# Patient Record
Sex: Male | Born: 1975 | Race: Black or African American | Hispanic: No | Marital: Single | Smoking: Current every day smoker
Health system: Southern US, Community
[De-identification: ages and names within clinical notes are randomized; demographics above are authoritative.]

## PROBLEM LIST (undated history)

## (undated) DIAGNOSIS — F200 Paranoid schizophrenia: Secondary | ICD-10-CM

## (undated) DIAGNOSIS — Z720 Tobacco use: Secondary | ICD-10-CM

## (undated) DIAGNOSIS — K5090 Crohn's disease, unspecified, without complications: Secondary | ICD-10-CM

## (undated) HISTORY — PX: COLON SURGERY: SHX602

---

## 2005-07-10 ENCOUNTER — Ambulatory Visit: Payer: Self-pay | Admitting: Internal Medicine

## 2006-07-26 ENCOUNTER — Ambulatory Visit: Payer: Self-pay | Admitting: Internal Medicine

## 2006-10-10 ENCOUNTER — Emergency Department (HOSPITAL_COMMUNITY): Admission: EM | Admit: 2006-10-10 | Discharge: 2006-10-10 | Payer: Self-pay | Admitting: Emergency Medicine

## 2007-10-15 ENCOUNTER — Emergency Department (HOSPITAL_COMMUNITY): Admission: EM | Admit: 2007-10-15 | Discharge: 2007-10-15 | Payer: Self-pay | Admitting: Emergency Medicine

## 2008-07-18 ENCOUNTER — Other Ambulatory Visit: Payer: Self-pay | Admitting: Emergency Medicine

## 2008-07-18 ENCOUNTER — Ambulatory Visit: Payer: Self-pay | Admitting: Psychiatry

## 2008-07-18 ENCOUNTER — Inpatient Hospital Stay (HOSPITAL_COMMUNITY): Admission: RE | Admit: 2008-07-18 | Discharge: 2008-07-25 | Payer: Self-pay | Admitting: Psychiatry

## 2008-10-17 ENCOUNTER — Emergency Department (HOSPITAL_COMMUNITY): Admission: EM | Admit: 2008-10-17 | Discharge: 2008-10-17 | Payer: Self-pay | Admitting: Emergency Medicine

## 2008-12-27 ENCOUNTER — Emergency Department (HOSPITAL_COMMUNITY): Admission: EM | Admit: 2008-12-27 | Discharge: 2008-12-27 | Payer: Self-pay | Admitting: Emergency Medicine

## 2010-06-03 LAB — POCT I-STAT, CHEM 8
Calcium, Ion: 1.26 mmol/L (ref 1.12–1.32)
Glucose, Bld: 136 mg/dL — ABNORMAL HIGH (ref 70–99)
HCT: 44 % (ref 39.0–52.0)
Hemoglobin: 15 g/dL (ref 13.0–17.0)
TCO2: 21 mmol/L (ref 0–100)

## 2010-06-03 LAB — RAPID URINE DRUG SCREEN, HOSP PERFORMED
Amphetamines: NOT DETECTED
Benzodiazepines: NOT DETECTED
Cocaine: NOT DETECTED
Opiates: NOT DETECTED
Tetrahydrocannabinol: NOT DETECTED

## 2010-06-03 LAB — ETHANOL: Alcohol, Ethyl (B): <5 mg/dL (ref 0–10)

## 2010-07-08 NOTE — Discharge Summary (Signed)
NAMELELON, IKARD NO.:  [id number]   MEDICAL RECORD NO.:  [id number]          PATIENT TYPE:  IPS   LOCATION:  0402                          FACILITY:  [facility]   PHYSICIAN:  Anselm Jungling, MD  DATE OF BIRTH:  01-17-1976   DATE OF ADMISSION:  07/18/2008  DATE OF DISCHARGE:  07/25/2008                               DISCHARGE SUMMARY   IDENTIFYING DATA AND REASON FOR ADMISSION:  This was an inpatient  psychiatric admission for Larry Leonard, a 35 year old male, who was admitted  due to increasing symptoms of psychosis with suicidal ideation.  Please  refer to the admission note for further details pertaining to the  symptoms, circumstances, and history that led to his hospitalization.  He was given an initial Axis I diagnosis of psychosis NOS.   MEDICAL AND LABORATORY:  The patient was medically and physically  assessed by the psychiatric nurse practitioner.  He came to [place] with a  history of Crohn's disease.  He was continued on his usual Pentasa and  calcium carbonate.  There were no significant medical issues.   HOSPITAL COURSE:  The patient was admitted to the adult inpatient  psychiatric service.  He presented as a well-nourished, normally-  developed adult male who was generally pleasant and cooperative.  He was  fully oriented.  Though he was pleasant, his thoughts were disorganized.  He had some insight into the need for treatment, and was accepting of  his hospitalization and need for medication.  He denied any active  suicidal plan, impulse or intent.   He was treated with a psychotropic regimen of Seroquel, and Cogentin was  added to address some mild akathisia that occurred later on in his stay.  He participated in therapeutic groups and activities, and was a  reasonably good participant.  He remained cooperative, nonirritable, and  polite throughout his stay.   He appeared appropriate for discharge on the eighth hospital day.  His  guardian, Ms.  Larry Leonard, was contacted by phone, and discharge and  aftercare were coordinated with her.   DISCHARGE AND AFTERCARE PLAN:  The patient was to follow up with his  usual outpatient providers.  Times and dates to be arranged at the time  of this dictation.   DISCHARGE MEDICATIONS:  1. Seroquel 1000 mg q.h.s.  2. Pentasa 500 SR daily.  3. Calcium carbonate 500 mg 3 times daily.  4. Cogentin 1 mg b.i.d.   The patient was given 7 days of samples plus 30 day prescriptions for  the above.   DISCHARGE DIAGNOSES:  Axis I:  Schizophrenia, chronic undifferentiated  type, acute exacerbation, resolving.  Axis II:  Deferred.  Axis III:  History of Crohn's disease.  Axis IV:  Stressors severe.  Axis V:  Global assessment of functioning on discharge 55.      Anselm Jungling, MD  Electronically Signed     SPB/MEDQ  D:  07/25/2008  T:  07/25/2008  Job:  [id number]

## 2010-07-08 NOTE — H&P (Signed)
Larry Leonard, NORGAARD NO.:  [id number]   MEDICAL RECORD NO.:  [id number]          PATIENT TYPE:  IPS   LOCATION:  0403                          FACILITY:  [facility]   PHYSICIAN:  Anselm Jungling, MD  DATE OF BIRTH:  June 12, 1975   DATE OF ADMISSION:  07/18/2008  DATE OF DISCHARGE:                       PSYCHIATRIC ADMISSION ASSESSMENT   This is on a 35 year old male that was involuntarily petitioned on Jul 18, 2008.   HISTORY OF PRESENT ILLNESS:  The patient is here on petition with papers  stating he is delusional, suicidal and responding to internal stimuli.  The patient was sent to the emergency department after the mobile crisis  was called as patient has been decompensating.  The patient does endorse  visual hallucinations, but of unknown content.   PAST PSYCHIATRIC HISTORY:  First admission to [facility].  Unclear if the patient has had past hospitalizations.  The patient  apparently may be a client at [facility].   SOCIAL HISTORY:  This is a 35 year old male who has been living at the  [facility].  He has an 11th grade education.  His support system is  unknown.   FAMILY HISTORY:  Unknown.   ALCOHOL/DRUG HISTORY:  No apparent alcohol or drug use.   PRIMARY CARE [name]:  Unknown.   MEDICAL HISTORY:  History of Crohn's disease.   MEDICATIONS:  1. __________.  2. Calcium.  3. Seroquel.  4. Wellbutrin.  Compliance is also unclear.   DRUG ALLERGIES:  NO KNOWN ALLERGIES.   PHYSICAL EXAMINATION:  GENERAL:  This is a thin unkempt male.  He was  fully assessed at [facility].  He did receive  Geodon 10 mg IM twice.  VITAL SIGNS:  Temperature is 97.4, 85 heart rate, 112/70 blood pressure.   LABORATORY DATA:  Urine drug screen was negative.  Alcohol level was  less than 5.   MENTAL STATUS EXAM:  The patient was resting in his bed.  When awakened,  he seemed very confused and jumped out of bed.   He is currently dressed  in hospital scrubs.  There is no eye contact.  His speech is gibberish,  difficult to understand any words.  Noted in the chart that the patient  was initially religiously preoccupied.  Does appear the patient could be  responding to internal stimuli.  He is a poor historian and poor  judgment.   AXIS I:  Psychosis not otherwise specified.  AXIS II:  Deferred.  AXIS III:  History of Crohn's disease.  AXIS IV:  Deferred at this time.  AXIS V:  Current is 20.   PLAN:  Our plan is to continue to gather more history and identify  support.  At this time, we will continue with the Seroquel to stabilize  his mood and thinking.  Identify his living arrangements.  His tentative  length of stay at this time initially is 4-6 days depending on the  patient's response to medication.      [name], N.P.      Anselm Jungling, MD  Electronically Signed    JO/MEDQ  D:  07/19/2008  T:  07/19/2008  Job:  [id number]

## 2010-07-08 NOTE — Assessment & Plan Note (Signed)
NAMEMarland Kitchen  Larry, Leonard                  CHART#:  [id number]   DATE:  07/26/2006                       DOB:  November 05, 1975   Followup of Crohn's disease.  Last seen 07/10/2005.  He has a history of  small bowel Crohn's disease.  He has done well.  He has gained 4 pounds  since his last visit and he has 1-2 bowel movements daily.  Takes a  Questran 4 g daily p.r.n., Pentasa 1 g 3-4 times daily.  He really has  had not much with abdominal pain, nausea, or vomiting, no melena, rectal  bleeding.  His sense of well being has been good.  He has not had any  other significant concurrent medical problems.   CURRENT MEDICATIONS:  See updated list.   ALLERGIES:  No known drug allergies.   PHYSICAL EXAMINATION:  He looks well.  Weight 158, height 5 feet 6 inches, temp 97.8, BP 120/84, pulse 80.  ABDOMEN:  Flat, positive bowel sounds.  Well-healed surgical scar, soft,  nontender without appreciable mass or organomegaly.   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 Questran.  Overall, he is doing very well.  He needs some routine  blood work, need to check renal function, etc.  Basically, we will get a  reference CRP, CBC, and a BMET today.  Continue Pentasa 1 g t.i.d.  May  use Questran no more than once daily, not to be taken within 2 hours of  other meds.  Telephone followup.  Tentatively plan to see him back in 1  year's time.       Larry Leonard, M.D.  Electronically Signed     RMR/MEDQ  D:  07/26/2006  T:  07/26/2006  Job:  [id number]   cc:   Tesfaye D. Felecia Shelling, MD

## 2010-09-21 ENCOUNTER — Emergency Department (HOSPITAL_COMMUNITY)
Admission: EM | Admit: 2010-09-21 | Discharge: 2010-09-21 | Disposition: A | Discharge status: Home / Self Care | Payer: Medicare Other | Attending: Emergency Medicine | Admitting: Emergency Medicine

## 2010-09-21 ENCOUNTER — Emergency Department (HOSPITAL_COMMUNITY): Payer: Medicare Other

## 2010-09-21 DIAGNOSIS — H5710 Ocular pain, unspecified eye: Secondary | ICD-10-CM | POA: Insufficient documentation

## 2010-09-21 DIAGNOSIS — S0003XA Contusion of scalp, initial encounter: Secondary | ICD-10-CM | POA: Insufficient documentation

## 2010-09-21 DIAGNOSIS — S00209A Unspecified superficial injury of unspecified eyelid and periocular area, initial encounter: Secondary | ICD-10-CM | POA: Insufficient documentation

## 2010-10-10 ENCOUNTER — Emergency Department (HOSPITAL_COMMUNITY)
Admission: EM | Admit: 2010-10-10 | Discharge: 2010-10-10 | Disposition: A | Discharge status: Home / Self Care | Payer: Medicare Other | Attending: Emergency Medicine | Admitting: Emergency Medicine

## 2010-10-10 DIAGNOSIS — K5090 Crohn's disease, unspecified, without complications: Secondary | ICD-10-CM | POA: Insufficient documentation

## 2010-10-10 DIAGNOSIS — F3289 Other specified depressive episodes: Secondary | ICD-10-CM | POA: Insufficient documentation

## 2010-10-10 DIAGNOSIS — Z79899 Other long term (current) drug therapy: Secondary | ICD-10-CM | POA: Insufficient documentation

## 2010-10-10 DIAGNOSIS — S51809A Unspecified open wound of unspecified forearm, initial encounter: Secondary | ICD-10-CM | POA: Insufficient documentation

## 2010-10-10 DIAGNOSIS — F329 Major depressive disorder, single episode, unspecified: Secondary | ICD-10-CM | POA: Insufficient documentation

## 2010-10-10 DIAGNOSIS — M79609 Pain in unspecified limb: Secondary | ICD-10-CM | POA: Insufficient documentation

## 2010-10-21 ENCOUNTER — Emergency Department (HOSPITAL_COMMUNITY)
Admission: EM | Admit: 2010-10-21 | Discharge: 2010-10-21 | Disposition: A | Discharge status: Home / Self Care | Payer: Medicare Other | Attending: Emergency Medicine | Admitting: Emergency Medicine

## 2010-10-21 DIAGNOSIS — Z4802 Encounter for removal of sutures: Secondary | ICD-10-CM | POA: Insufficient documentation

## 2013-12-14 ENCOUNTER — Encounter (HOSPITAL_COMMUNITY): Payer: Self-pay | Admitting: Emergency Medicine

## 2013-12-14 ENCOUNTER — Emergency Department (HOSPITAL_COMMUNITY)
Admission: EM | Admit: 2013-12-14 | Discharge: 2013-12-15 | Disposition: A | Discharge status: Home / Self Care | Payer: Medicare Other | Attending: Emergency Medicine | Admitting: Emergency Medicine

## 2013-12-14 DIAGNOSIS — M79605 Pain in left leg: Secondary | ICD-10-CM

## 2013-12-14 DIAGNOSIS — Z8719 Personal history of other diseases of the digestive system: Secondary | ICD-10-CM | POA: Diagnosis not present

## 2013-12-14 DIAGNOSIS — Z8659 Personal history of other mental and behavioral disorders: Secondary | ICD-10-CM | POA: Diagnosis not present

## 2013-12-14 HISTORY — DX: Crohn's disease, unspecified, without complications: K50.90

## 2013-12-14 HISTORY — DX: Paranoid schizophrenia: F20.0

## 2013-12-14 MED ORDER — ENOXAPARIN SODIUM 60 MG/0.6ML ~~LOC~~ SOLN
60.0000 mg | Freq: Once | SUBCUTANEOUS | Status: AC
  Administered 2013-12-14: 60 mg via SUBCUTANEOUS
  Filled 2013-12-14: qty 0.6

## 2013-12-14 MED ORDER — HYDROCODONE-ACETAMINOPHEN 5-325 MG PO TABS
1.0000 | ORAL_TABLET | Freq: Once | ORAL | Status: AC
  Administered 2013-12-14: 1 via ORAL
  Filled 2013-12-14: qty 1

## 2013-12-14 MED ORDER — HYDROCODONE-ACETAMINOPHEN 5-325 MG PO TABS: 1.0000 | ORAL_TABLET | ORAL | Status: AC | PRN

## 2013-12-14 NOTE — ED Notes (Addendum)
Per EMS pt is c/o left hamstring pain x 2 weeks  Pt states he is tired of the pain so came for evaluation  Denies injury  [facility]

## 2013-12-14 NOTE — ED Notes (Signed)
Patient reports that sitting on a soft pillow occasionally reduces the pain in his left upper leg.

## 2013-12-14 NOTE — ED Provider Notes (Signed)
CSN: [id number]     Arrival date & time 12/14/13  2149 History   First MD Initiated Contact with Patient 12/14/13 2157     This chart was scribed for non-physician practitioner, Trixie DredgeEmily Ardys Hataway PA-C working with Gwyneth SproutWhitney Plunkett, MD by Arlan OrganAshley Leger, ED Scribe. This patient was seen in room WTR9/WTR9 and the patient's care was started at 10:35 PM.   Chief Complaint  Patient presents with  . Leg Pain   The history is provided by the patient. No language interpreter was used.    HPI Comments: Larry SheehanKevin D Leonard bought in by EMS is a 38 y.o. male  PMHx of paranoid schizophrenia and crohn disease who presents to the Emergency Department complaining of constant, moderate L posterior thigh and left calf pain x 2 weeks that is not improving. He describes pain as sharp/stabbing and states radiates just below the L buttock. Pain rated 10/10 in last week. No recent injury or trauma to lower extremities. Pain is exacerbated at night time without and is typically alleviated with rest. No fever, chills, rash, or discoloration of the skin. No leg swelling. He denies any numbness or paresthesia to the lower extremities. No bleeding associated with Crohn's disease. No recent long distance travel or immobilization. He denies a personal or family history of blood clots. [facility]. No known allergies to medications.  No past medical history on file. No past surgical history on file. No family history on file. History  Substance Use Topics  . Smoking status: Not on file  . Smokeless tobacco: Not on file  . Alcohol Use: Not on file    Review of Systems  Constitutional: Negative for fever and chills.  Musculoskeletal: Positive for arthralgias.  Skin: Negative for color change and rash.  All other systems reviewed and are negative.     Allergies  Review of patient's allergies indicates not on file.  Home Medications   Prior to Admission medications   Not on File   Triage  Vitals: BP 137/85  Pulse 64  Resp 18  SpO2 99%   Physical Exam  Nursing note and vitals reviewed. Constitutional: He appears well-developed and well-nourished. No distress.  HENT:  Head: Normocephalic and atraumatic.  Neck: Neck supple.  Cardiovascular:  Pulses:      Dorsalis pedis pulses are 2+ on the left side.       Posterior tibial pulses are 2+ on the left side.  Strong distal pulses Tender to L calf and posterior calf  Pulmonary/Chest: Effort normal.  Musculoskeletal:  No skin changes No erythema or edema No focal tenderness Tenderness to palpation to posterior L thigh and L calf  L thigh measures 44 cm R thigh measures 43 cm L calf  measures 34.5 cm R calf measures 33 cm  Spine nontender, no crepitus, or stepoffs. Lower extremities:  Strength 5/5, sensation intact, distal pulses intact.     Neurological: He is alert.  Skin: He is not diaphoretic.    ED Course  Procedures (including critical care time)  DIAGNOSTIC STUDIES: Oxygen Saturation is 99% on RA, Normal by my interpretation.    COORDINATION OF CARE: 10:35 PM-Discussed treatment plan with pt at bedside and pt agreed to plan.     Labs Review Labs Reviewed - No data to display  Imaging Review No results found.   EKG Interpretation None      MDM   Final diagnoses:  Left leg pain    Afebrile, nontoxic patient with  left leg pain x 2 weeks.  Tender over posterior thigh and left calf.  Legs measured, left is slightly larger than right.  Pt reporting 10/10 pain. No noted injury.  Neurovascularly intact. No clinical e/o cellulitis.  Doubt radicular etiology of pain.  Likely muscular though will r/o DVT with venous duplex US in the morning.  Given Lovenox and norco in ED.   D/C home with norco and [facility]   Discussed result, findings, treatment, and follow up  with patient.  Pt given return precautions.  Pt verbalizes understanding and agrees with plan.       I  personally performed the services described in this documentation, which was scribed in my presence. The recorded information has been reviewed and is accurate.    Trixie DredgeEmily Martesha Niedermeier, PA-C 12/15/13 0006

## 2013-12-14 NOTE — Discharge Instructions (Signed)
Read the information below.  Use the prescribed medication as directed.  Please discuss all new medications with your pharmacist.  Do not take additional tylenol while taking the prescribed pain medication to avoid overdose.  You may return to the Emergency Department at any time for worsening condition or any new symptoms that concern you.  If you develop uncontrolled pain, weakness or numbness of the extremity, severe discoloration of the skin, or you are unable to walk, return to the ER for a recheck.      Musculoskeletal Pain Musculoskeletal pain is muscle and boney aches and pains. These pains can occur in any part of the body. Your caregiver may treat you without knowing the cause of the pain. They may treat you if blood or urine tests, X-rays, and other tests were normal.  CAUSES There is often not a definite cause or reason for these pains. These pains may be caused by a type of germ (virus). The discomfort may also come from overuse. Overuse includes working out too hard when your body is not fit. Boney aches also come from weather changes. Bone is sensitive to atmospheric pressure changes. HOME CARE INSTRUCTIONS   Ask when your test results will be ready. Make sure you get your test results.  Only take over-the-counter or prescription medicines for pain, discomfort, or fever as directed by your caregiver. If you were given medications for your condition, do not drive, operate machinery or power tools, or sign legal documents for 24 hours. Do not drink alcohol. Do not take sleeping pills or other medications that may interfere with treatment.  Continue all activities unless the activities cause more pain. When the pain lessens, slowly resume normal activities. Gradually increase the intensity and duration of the activities or exercise.  During periods of severe pain, bed rest may be helpful. Lay or sit in any position that is comfortable.  Putting ice on the injured area.  Put ice in a  bag.  Place a towel between your skin and the bag.  Leave the ice on for 15 to 20 minutes, 3 to 4 times a day.  Follow up with your caregiver for continued problems and no reason can be found for the pain. If the pain becomes worse or does not go away, it may be necessary to repeat tests or do additional testing. Your caregiver may need to look further for a possible cause. SEEK IMMEDIATE MEDICAL CARE IF:  You have pain that is getting worse and is not relieved by medications.  You develop chest pain that is associated with shortness or breath, sweating, feeling sick to your stomach (nauseous), or throw up (vomit).  Your pain becomes localized to the abdomen.  You develop any new symptoms that seem different or that concern you. MAKE SURE YOU:   Understand these instructions.  Will watch your condition.  Will get help right away if you are not doing well or get worse. Document Released: 02/09/2005 Document Revised: 05/04/2011 Document Reviewed: 10/14/2012 [facility] Patient Information 2015 [employer] [place]. This information is not intended to replace advice given to you by your health care provider. Make sure you discuss any questions you have with your health care provider.

## 2013-12-14 NOTE — ED Notes (Signed)
Emily, PA at bedside.

## 2013-12-15 NOTE — ED Notes (Signed)
Called [facility] for transport back to [facility]

## 2013-12-17 NOTE — ED Provider Notes (Signed)
Medical screening examination/treatment/procedure(s) were performed by non-physician practitioner and as supervising physician I was immediately available for consultation/collaboration.   EKG Interpretation None        Cleota Pellerito, MD 12/17/13 0809 

## 2014-10-22 ENCOUNTER — Encounter (HOSPITAL_COMMUNITY): Payer: Self-pay | Admitting: Emergency Medicine

## 2014-10-22 ENCOUNTER — Inpatient Hospital Stay (HOSPITAL_COMMUNITY)
Admission: EM | Admit: 2014-10-22 | Discharge: 2014-10-27 | DRG: 385 | Disposition: A | Discharge status: Home / Self Care | Payer: Medicare Other | Attending: Family Medicine | Admitting: Family Medicine

## 2014-10-22 ENCOUNTER — Emergency Department (HOSPITAL_COMMUNITY): Payer: Medicare Other

## 2014-10-22 DIAGNOSIS — K5010 Crohn's disease of large intestine without complications: Secondary | ICD-10-CM | POA: Insufficient documentation

## 2014-10-22 DIAGNOSIS — K5660 Unspecified intestinal obstruction: Secondary | ICD-10-CM | POA: Diagnosis present

## 2014-10-22 DIAGNOSIS — K5669 Other intestinal obstruction: Secondary | ICD-10-CM | POA: Diagnosis not present

## 2014-10-22 DIAGNOSIS — F200 Paranoid schizophrenia: Secondary | ICD-10-CM | POA: Diagnosis present

## 2014-10-22 DIAGNOSIS — Z79899 Other long term (current) drug therapy: Secondary | ICD-10-CM | POA: Diagnosis not present

## 2014-10-22 DIAGNOSIS — R1033 Periumbilical pain: Secondary | ICD-10-CM | POA: Insufficient documentation

## 2014-10-22 DIAGNOSIS — R001 Bradycardia, unspecified: Secondary | ICD-10-CM | POA: Diagnosis not present

## 2014-10-22 DIAGNOSIS — K50112 Crohn's disease of large intestine with intestinal obstruction: Secondary | ICD-10-CM

## 2014-10-22 DIAGNOSIS — E860 Dehydration: Secondary | ICD-10-CM | POA: Diagnosis present

## 2014-10-22 DIAGNOSIS — K50012 Crohn's disease of small intestine with intestinal obstruction: Principal | ICD-10-CM | POA: Diagnosis present

## 2014-10-22 DIAGNOSIS — E43 Unspecified severe protein-calorie malnutrition: Secondary | ICD-10-CM | POA: Diagnosis present

## 2014-10-22 DIAGNOSIS — F1020 Alcohol dependence, uncomplicated: Secondary | ICD-10-CM | POA: Diagnosis present

## 2014-10-22 DIAGNOSIS — R64 Cachexia: Secondary | ICD-10-CM | POA: Diagnosis present

## 2014-10-22 DIAGNOSIS — Z6820 Body mass index (BMI) 20.0-20.9, adult: Secondary | ICD-10-CM

## 2014-10-22 DIAGNOSIS — F17210 Nicotine dependence, cigarettes, uncomplicated: Secondary | ICD-10-CM | POA: Diagnosis present

## 2014-10-22 DIAGNOSIS — N390 Urinary tract infection, site not specified: Secondary | ICD-10-CM | POA: Diagnosis present

## 2014-10-22 DIAGNOSIS — K56609 Unspecified intestinal obstruction, unspecified as to partial versus complete obstruction: Secondary | ICD-10-CM | POA: Diagnosis present

## 2014-10-22 DIAGNOSIS — K50119 Crohn's disease of large intestine with unspecified complications: Secondary | ICD-10-CM | POA: Diagnosis not present

## 2014-10-22 DIAGNOSIS — Z720 Tobacco use: Secondary | ICD-10-CM | POA: Diagnosis not present

## 2014-10-22 DIAGNOSIS — Z8719 Personal history of other diseases of the digestive system: Secondary | ICD-10-CM

## 2014-10-22 HISTORY — DX: Tobacco use: Z72.0

## 2014-10-22 LAB — URINALYSIS, ROUTINE W REFLEX MICROSCOPIC
Glucose, UA: NEGATIVE mg/dL
HGB URINE DIPSTICK: NEGATIVE
Ketones, ur: 15 mg/dL — AB
Nitrite: NEGATIVE
PROTEIN: 30 mg/dL — AB
SPECIFIC GRAVITY, URINE: 1.034 — AB (ref 1.005–1.030)
UROBILINOGEN UA: 0.2 mg/dL (ref 0.0–1.0)
pH: 5.5 (ref 5.0–8.0)

## 2014-10-22 LAB — COMPREHENSIVE METABOLIC PANEL
ALBUMIN: 3.8 g/dL (ref 3.5–5.0)
ALT: 37 U/L (ref 17–63)
ANION GAP: 12 (ref 5–15)
AST: 26 U/L (ref 15–41)
Alkaline Phosphatase: 95 U/L (ref 38–126)
BUN: 6 mg/dL (ref 6–20)
CO2: 26 mmol/L (ref 22–32)
Calcium: 9.9 mg/dL (ref 8.9–10.3)
Chloride: 98 mmol/L — ABNORMAL LOW (ref 101–111)
Creatinine, Ser: 0.94 mg/dL (ref 0.61–1.24)
GFR calc non Af Amer: >60 mL/min (ref >60)
GLUCOSE: 179 mg/dL — AB (ref 65–99)
POTASSIUM: 4.2 mmol/L (ref 3.5–5.1)
SODIUM: 136 mmol/L (ref 135–145)
TOTAL PROTEIN: 8.7 g/dL — AB (ref 6.5–8.1)
Total Bilirubin: 0.6 mg/dL (ref 0.3–1.2)

## 2014-10-22 LAB — URINE MICROSCOPIC-ADD ON

## 2014-10-22 LAB — CBC
HEMATOCRIT: 44.6 % (ref 39.0–52.0)
HEMOGLOBIN: 15.5 g/dL (ref 13.0–17.0)
MCH: 27.2 pg (ref 26.0–34.0)
MCHC: 34.8 g/dL (ref 30.0–36.0)
MCV: 78.2 fL (ref 78.0–100.0)
Platelets: 322 10*3/uL (ref 150–400)
RBC: 5.7 MIL/uL (ref 4.22–5.81)
RDW: 15.1 % (ref 11.5–15.5)
WBC: 9.3 10*3/uL (ref 4.0–10.5)

## 2014-10-22 LAB — PREALBUMIN: Prealbumin: 16.1 mg/dL — ABNORMAL LOW (ref 18–38)

## 2014-10-22 LAB — LIPASE, BLOOD: Lipase: 16 U/L — ABNORMAL LOW (ref 22–51)

## 2014-10-22 MED ORDER — OLANZAPINE 10 MG PO TABS
10.0000 mg | ORAL_TABLET | Freq: Every day | ORAL | Status: DC
  Administered 2014-10-22 – 2014-10-26 (×5): 10 mg via ORAL
  Filled 2014-10-22 (×6): qty 1

## 2014-10-22 MED ORDER — ONDANSETRON 4 MG PO TBDP: 4.0000 mg | ORAL_TABLET | Freq: Once | ORAL | Status: DC | PRN

## 2014-10-22 MED ORDER — PROMETHAZINE HCL 25 MG/ML IJ SOLN: 12.5000 mg | Freq: Four times a day (QID) | INTRAMUSCULAR | Status: DC | PRN

## 2014-10-22 MED ORDER — ACETAMINOPHEN 650 MG RE SUPP: 650.0000 mg | Freq: Four times a day (QID) | RECTAL | Status: DC | PRN

## 2014-10-22 MED ORDER — PROMETHAZINE HCL 25 MG/ML IJ SOLN
12.5000 mg | Freq: Once | INTRAMUSCULAR | Status: AC
  Administered 2014-10-22: 12.5 mg via INTRAVENOUS
  Filled 2014-10-22: qty 1

## 2014-10-22 MED ORDER — GI COCKTAIL ~~LOC~~
30.0000 mL | Freq: Once | ORAL | Status: AC
  Administered 2014-10-22: 30 mL via ORAL
  Filled 2014-10-22: qty 30

## 2014-10-22 MED ORDER — ENOXAPARIN SODIUM 40 MG/0.4ML ~~LOC~~ SOLN
40.0000 mg | SUBCUTANEOUS | Status: DC
  Administered 2014-10-22 – 2014-10-26 (×5): 40 mg via SUBCUTANEOUS
  Filled 2014-10-22 (×5): qty 0.4

## 2014-10-22 MED ORDER — HYDROMORPHONE HCL 1 MG/ML IJ SOLN
1.0000 mg | Freq: Once | INTRAMUSCULAR | Status: AC
  Administered 2014-10-22: 1 mg via INTRAVENOUS
  Filled 2014-10-22: qty 1

## 2014-10-22 MED ORDER — METHYLPREDNISOLONE SODIUM SUCC 40 MG IJ SOLR
40.0000 mg | Freq: Two times a day (BID) | INTRAMUSCULAR | Status: DC
  Administered 2014-10-22 – 2014-10-26 (×8): 40 mg via INTRAVENOUS
  Filled 2014-10-22 (×8): qty 1

## 2014-10-22 MED ORDER — QUETIAPINE FUMARATE 50 MG PO TABS
50.0000 mg | ORAL_TABLET | Freq: Every day | ORAL | Status: DC
  Administered 2014-10-22 – 2014-10-26 (×5): 50 mg via ORAL
  Filled 2014-10-22 (×5): qty 1

## 2014-10-22 MED ORDER — ALBUTEROL SULFATE HFA 108 (90 BASE) MCG/ACT IN AERS: 1.0000 | INHALATION_SPRAY | Freq: Four times a day (QID) | RESPIRATORY_TRACT | Status: DC | PRN

## 2014-10-22 MED ORDER — IOHEXOL 300 MG/ML  SOLN
100.0000 mL | Freq: Once | INTRAMUSCULAR | Status: AC | PRN
  Administered 2014-10-22: 100 mL via INTRAVENOUS

## 2014-10-22 MED ORDER — PROMETHAZINE HCL 25 MG PO TABS: 12.5000 mg | ORAL_TABLET | Freq: Four times a day (QID) | ORAL | Status: DC | PRN

## 2014-10-22 MED ORDER — DEXTROSE 5 % IV SOLN
1.0000 g | Freq: Once | INTRAVENOUS | Status: AC
  Administered 2014-10-22: 1 g via INTRAVENOUS
  Filled 2014-10-22: qty 10

## 2014-10-22 MED ORDER — DEXTROSE-NACL 5-0.45 % IV SOLN
INTRAVENOUS | Status: DC
  Administered 2014-10-22 – 2014-10-25 (×7): via INTRAVENOUS

## 2014-10-22 MED ORDER — ALBUTEROL SULFATE (2.5 MG/3ML) 0.083% IN NEBU: 2.5000 mg | INHALATION_SOLUTION | Freq: Four times a day (QID) | RESPIRATORY_TRACT | Status: DC | PRN

## 2014-10-22 MED ORDER — IOHEXOL 300 MG/ML  SOLN
25.0000 mL | Freq: Once | INTRAMUSCULAR | Status: AC | PRN
  Administered 2014-10-22: 25 mL via ORAL

## 2014-10-22 MED ORDER — PROMETHAZINE HCL 25 MG RE SUPP
12.5000 mg | Freq: Four times a day (QID) | RECTAL | Status: DC | PRN
Start: 2014-10-22 — End: 2014-10-27

## 2014-10-22 MED ORDER — ONDANSETRON HCL 4 MG/2ML IJ SOLN
4.0000 mg | Freq: Once | INTRAMUSCULAR | Status: AC
  Administered 2014-10-22: 4 mg via INTRAVENOUS
  Filled 2014-10-22: qty 2

## 2014-10-22 MED ORDER — ACETAMINOPHEN 325 MG PO TABS: 650.0000 mg | ORAL_TABLET | Freq: Four times a day (QID) | ORAL | Status: DC | PRN

## 2014-10-22 NOTE — ED Notes (Signed)
Pt states that he is unable to give urine specimen at this time.

## 2014-10-22 NOTE — ED Provider Notes (Signed)
CSN: [id number]     Arrival date & time 10/22/14  0529 History   First MD Initiated Contact with Patient 10/22/14 0617     Chief Complaint  Patient presents with  . Abdominal Pain     (Consider location/radiation/quality/duration/timing/severity/associated sxs/prior Treatment) HPI Comments: Pt is a 39 yo male with history of crohn's, schizophrenia and appendectomy who presents to the ED from [facility] with complaint of abdominal pain, onset last night. Pt reports sharp, constant nonradiating pain at his umbilicus. Endorses N/V/D, nonbilious nonbloody emesis. He reports history of chronic diarrhea. Denies fever, headache, sore throat, SOB, CP, urinary sxs, blood in stool, numbness, tingling, weakness. Pt reports he has been having abdominal pain for the past 2 months but reports that it became worse last night after eating. Pt does not have a PCP and states he has not seen GI. Pt only endorses appendectomy for abdominal surgeries.   Patient is a 39 y.o. male presenting with abdominal pain.  Abdominal Pain Associated symptoms: diarrhea, nausea and vomiting     Past Medical History  Diagnosis Date  . Crohn's disease   . Paranoid schizophrenia   . Tobacco use 10/22/2014   Past Surgical History  Procedure Laterality Date  . Colon surgery     No family history on file. Social History  Substance Use Topics  . Smoking status: Current Every Day Smoker -- 1.00 packs/day    Types: Cigarettes    Start date: 02/23/1990  . Smokeless tobacco: None  . Alcohol Use: No    Review of Systems  Gastrointestinal: Positive for nausea, vomiting, abdominal pain and diarrhea.  All other systems reviewed and are negative.     Allergies  Review of patient's allergies indicates no known allergies.  Home Medications   Prior to Admission medications   Medication Sig Start Date End Date Taking? Authorizing Provider  albuterol (PROVENTIL HFA;VENTOLIN HFA) 108 (90 BASE) MCG/ACT inhaler Inhale 1 puff  into the lungs every 6 (six) hours as needed for wheezing or shortness of breath.   Yes Historical Provider, MD  ARIPiprazole (ABILIFY MAINTENA) 400 MG SUSR Inject 400 mg into the muscle every 30 (thirty) days.   Yes Historical Provider, MD  docusate sodium (COLACE) 100 MG capsule Take 100 mg by mouth daily.   Yes Historical Provider, MD  naltrexone (DEPADE) 50 MG tablet Take 50 mg by mouth daily.   Yes Historical Provider, MD  naproxen (NAPROSYN) 500 MG tablet Take 500 mg by mouth 2 (two) times daily as needed for moderate pain.    Yes Historical Provider, MD  OLANZapine (ZYPREXA) 10 MG tablet Take 10 mg by mouth at bedtime.   Yes Historical Provider, MD  QUEtiapine (SEROQUEL) 50 MG tablet Take 50 mg by mouth at bedtime.   Yes Historical Provider, MD  HYDROcodone-acetaminophen (NORCO/VICODIN) 5-325 MG per tablet Take 1 tablet by mouth every 4 (four) hours as needed for moderate pain or severe pain. Patient not taking: Reported on 10/22/2014 12/14/13   Trixie Dredge, PA-C   BP 124/79 mmHg  Pulse 66  Temp(Src) 97.6 F (36.4 C) (Oral)  Resp 16  SpO2 97% Physical Exam  Constitutional: He is oriented to person, place, and time. He appears well-developed and well-nourished.  HENT:  Head: Normocephalic and atraumatic.  Mouth/Throat: Oropharynx is clear and moist.  Eyes: Conjunctivae and EOM are normal. Pupils are equal, round, and reactive to light. Right eye exhibits no discharge. Left eye exhibits no discharge. No scleral icterus.  Neck: Normal range of  motion. Neck supple.  Cardiovascular: Normal rate, regular rhythm, normal heart sounds and intact distal pulses.   Pulmonary/Chest: Effort normal and breath sounds normal. He has no wheezes. He has no rales. He exhibits no tenderness.  Abdominal: Soft. Bowel sounds are normal. He exhibits no distension and no mass. There is tenderness. There is no rigidity, no rebound, no guarding and no CVA tenderness.  TTP at umbilicus.  Musculoskeletal: Normal  range of motion. He exhibits no edema.  Lymphadenopathy:    He has no cervical adenopathy.  Neurological: He is alert and oriented to person, place, and time.  Skin: Skin is warm and dry.  Nursing note and vitals reviewed.   ED Course  Procedures (including critical care time) Labs Review Labs Reviewed  LIPASE, BLOOD - Abnormal; Notable for the following:    Lipase 16 (*)    All other components within normal limits  COMPREHENSIVE METABOLIC PANEL - Abnormal; Notable for the following:    Chloride 98 (*)    Glucose, Bld 179 (*)    Total Protein 8.7 (*)    All other components within normal limits  URINALYSIS, ROUTINE W REFLEX MICROSCOPIC (NOT AT [facility]) - Abnormal; Notable for the following:    Color, Urine AMBER (*)    APPearance HAZY (*)    Specific Gravity, Urine 1.034 (*)    Bilirubin Urine SMALL (*)    Ketones, ur 15 (*)    Protein, ur 30 (*)    Leukocytes, UA TRACE (*)    All other components within normal limits  URINE MICROSCOPIC-ADD ON - Abnormal; Notable for the following:    Squamous Epithelial / LPF FEW (*)    Bacteria, UA MANY (*)    Casts GRANULAR CAST (*)    Crystals CA OXALATE CRYSTALS (*)    All other components within normal limits  URINE CULTURE  CBC  PREALBUMIN    Imaging Review Ct Abdomen Pelvis W Contrast  10/22/2014   CLINICAL DATA:  Umbilical pain for 2 months with nausea, vomiting, diarrhea for 2 days. History of Crohn's disease.  EXAM: CT ABDOMEN AND PELVIS WITH CONTRAST  TECHNIQUE: Multidetector CT imaging of the abdomen and pelvis was performed using the standard protocol following bolus administration of intravenous contrast.  CONTRAST:  OMNIPAQUE IOHEXOL 300 MG/ML  SOLN  COMPARISON:  None.  FINDINGS: Lower chest:  Clear lung bases.  Normal heart size.  Hepatobiliary: Normal liver. No hepatic lesion. Distended gallbladder which may be secondary to prolonged fasting. Normal caliber common bile duct.  Pancreas: Normal.  Spleen: Normal.   Adrenals/Urinary Tract: Normal adrenal glands. Normal kidneys. No obstructive uropathy. Normal bladder.  Stomach/Bowel: Fluid-filled distended stomach. Small bowel dilatation with numerous fluid-filled loops of small bowel measuring up to 3.9 cm. Severe terminal ileal bowel wall thickening and surrounding inflammatory changes. Prior cecal surgery with surgical suture line present. No abdominal or pelvic free fluid. No pneumatosis, pneumoperitoneum or portal venous gas.  Vascular/Lymphatic: Normal caliber abdominal aorta. No abdominal or pelvic lymphadenopathy.  Other: No fluid collection or hematoma.  Musculoskeletal: No acute osseous abnormality. No lytic or sclerotic osseous lesion.  IMPRESSION: 1. Severe terminal ileal bowel wall thickening and surrounding inflammatory changes most consistent with terminal ileitis as can be seen with Crohn's disease given the patient's history. This is resulting in small bowel obstruction with significant small bowel dilatation and gastric distension.   Electronically Signed   By: Elige Ko   On: 10/22/2014 10:16   Dg Chest Portable 1 View  10/22/2014   CLINICAL DATA:  Nasogastric tube insertion.  EXAM: PORTABLE CHEST - 1 VIEW  COMPARISON:  10/17/2008  FINDINGS: There has been placement of enteric catheter coiled on itself over the expected location of gastric body. A second radiopaque catheter is seen transversing the midline of thorax and upper abdomen, with tip collimated off the image.  Cardiomediastinal silhouette is normal. Mediastinal contours appear intact.  There is no evidence of focal airspace consolidation, pleural effusion or pneumothorax.  Osseous structures are without acute abnormality. Residual contrast is seen within the collecting system of both kidneys. There is gaseous distention of the visualized small bowel loops. Soft tissues are grossly normal.  IMPRESSION: Enteric catheter with distal tip seen overlying the expected location of gastric body.   Second catheter transversing the thorax with tip collimated off the image.  Gaseous distention of small bowel loops seen in the upper abdomen, similar to previously seen small bowel obstruction, demonstrated by CT from the same date.   Electronically Signed   By: Ted Mcalpine M.D.   On: 10/22/2014 11:48   I have personally reviewed and evaluated these images and lab results as part of my medical decision-making.  Filed Vitals:   10/22/14 1345  BP: 124/79  Pulse: 66  Temp:   Resp:    Meds given in ED:  Medications  ondansetron (ZOFRAN-ODT) disintegrating tablet 4 mg (not administered)  ondansetron (ZOFRAN) injection 4 mg (4 mg Intravenous Given 10/22/14 0623)  HYDROmorphone (DILAUDID) injection 1 mg (1 mg Intravenous Given 10/22/14 0732)  iohexol (OMNIPAQUE) 300 MG/ML solution 25 mL (25 mLs Oral Contrast Given 10/22/14 0736)  gi cocktail (Maalox,Lidocaine,Donnatal) (30 mLs Oral Given 10/22/14 0829)  promethazine (PHENERGAN) injection 12.5 mg (12.5 mg Intravenous Given 10/22/14 0830)  HYDROmorphone (DILAUDID) injection 1 mg (1 mg Intravenous Given 10/22/14 0925)  iohexol (OMNIPAQUE) 300 MG/ML solution 100 mL (100 mLs Intravenous Contrast Given 10/22/14 0955)  cefTRIAXone (ROCEPHIN) 1 g in dextrose 5 % 50 mL IVPB (0 g Intravenous Stopped 10/22/14 1125)    New Prescriptions   No medications on file     MDM   Final diagnoses:  Hx SBO    Pt presents with worsening abdominal pain with associated N/V/D. Vitals stable, afebrile. Mild TTP at umbilicus. Pt given pain meds and anti-emetics.  CBC, CMP, Lipase unremarkable. CT abd/pelvis ordered. UA positive for many bacteria and trace leuks. Pt given rocephin for UTI.   Pt reports pain has not improved. Pt given more pain meds. CT showed small bowel obstruction and prior cecal surgery. NG tube placed. Surgery and GI consulted. Spoke with General Surgery PA, who will come see the pt. Spoke with Dr. Dulce Sellar (GI), he states he will come see the  pt. Spoke with Gen Surgery PA in ED, he agrees with treatment plan while in ED consisting of NG tube, bowel rest and abx for UTI. Will consult medicine-unassigned for admission. Spoke with medicine, will admit to their service. Discussed plan for admission with pt.  Satira Sark [place], [place] 10/22/14 1444  Elwin Mocha, MD 10/22/14 [phone]

## 2014-10-22 NOTE — ED Notes (Signed)
General surgery at bedside. 

## 2014-10-22 NOTE — Consult Note (Signed)
[facility] Consultation Note  Referring Provider: Family Medicine Service (Dr. Lum Babe) Primary Care Physician:  No PCP Per Patient Primary Gastroenterologist:  Dr. Herbert Moors [facility], last seen 2010)  Reason for Consultation:  Abdominal pain, nausea, vomiting, history Crohn's disease  HPI: Larry Leonard is a 39 y.o. male admitted for above complaints.  Patient has history of paranoid schizophrenia and lives in group home. He has history of Crohn's disease for over 20 years, had large bowel resection with transient ostomy placement soon after his disease presentation with take-down 1-2 years later.  No records detailing his Crohn's disease or surgeries are identified.  He describes being on variety of medications for his Crohn's disease, but can't recall their names, and doesn't recall being on any Crohn's-related medication for the past several years.  He was in static state of GI health until 2 months ago.  At that time he began having progressive generalized crampy abdominal pain and non-bloody diarrhea.  Symptoms worse after eating, he lost his appetite, and has lost over 30 lbs unintentionally over the past several weeks.  Yesterday, he began having worsening pain as well as nausea and vomiting, which prompted his evaluation in the emergency department.  Mild flatus and no stool output over the past day or so.  CT scan showed inflammatory changes in right lower quadrant with upstream small bowel dilatation consistent with small bowel obstruction.   Past Medical History  Diagnosis Date  . Crohn's disease   . Paranoid schizophrenia   . Tobacco use 10/22/2014    Past Surgical History  Procedure Laterality Date  . Colon surgery      Prior to Admission medications   Medication Sig Start Date End Date Taking? Authorizing Provider  albuterol (PROVENTIL HFA;VENTOLIN HFA) 108 (90 BASE) MCG/ACT inhaler Inhale 1 puff into the lungs every 6 (six) hours as needed for  wheezing or shortness of breath.   Yes Historical Provider, MD  ARIPiprazole (ABILIFY MAINTENA) 400 MG SUSR Inject 400 mg into the muscle every 30 (thirty) days.   Yes Historical Provider, MD  docusate sodium (COLACE) 100 MG capsule Take 100 mg by mouth daily.   Yes Historical Provider, MD  naltrexone (DEPADE) 50 MG tablet Take 50 mg by mouth daily.   Yes Historical Provider, MD  naproxen (NAPROSYN) 500 MG tablet Take 500 mg by mouth 2 (two) times daily as needed for moderate pain.    Yes Historical Provider, MD  OLANZapine (ZYPREXA) 10 MG tablet Take 10 mg by mouth at bedtime.   Yes Historical Provider, MD  QUEtiapine (SEROQUEL) 50 MG tablet Take 50 mg by mouth at bedtime.   Yes Historical Provider, MD  HYDROcodone-acetaminophen (NORCO/VICODIN) 5-325 MG per tablet Take 1 tablet by mouth every 4 (four) hours as needed for moderate pain or severe pain. Patient not taking: Reported on 10/22/2014 12/14/13   Trixie Dredge, PA-C    Current Facility-Administered Medications  Medication Dose Route Frequency Provider Last Rate Last Dose  . ondansetron (ZOFRAN-ODT) disintegrating tablet 4 mg  4 mg Oral Once PRN Loren Racer, MD       Current Outpatient Prescriptions  Medication Sig Dispense Refill  . albuterol (PROVENTIL HFA;VENTOLIN HFA) 108 (90 BASE) MCG/ACT inhaler Inhale 1 puff into the lungs every 6 (six) hours as needed for wheezing or shortness of breath.    . ARIPiprazole (ABILIFY MAINTENA) 400 MG SUSR Inject 400 mg into the muscle every 30 (thirty) days.    Marland Kitchen docusate sodium (COLACE) 100 MG capsule  Take 100 mg by mouth daily.    . naltrexone (DEPADE) 50 MG tablet Take 50 mg by mouth daily.    . naproxen (NAPROSYN) 500 MG tablet Take 500 mg by mouth 2 (two) times daily as needed for moderate pain.     Marland Kitchen OLANZapine (ZYPREXA) 10 MG tablet Take 10 mg by mouth at bedtime.    Marland Kitchen QUEtiapine (SEROQUEL) 50 MG tablet Take 50 mg by mouth at bedtime.    Marland Kitchen HYDROcodone-acetaminophen (NORCO/VICODIN) 5-325 MG  per tablet Take 1 tablet by mouth every 4 (four) hours as needed for moderate pain or severe pain. (Patient not taking: Reported on 10/22/2014) 10 tablet 0    Allergies as of 10/22/2014  . (No Known Allergies)    No family history on file.  Social History   Social History  . Marital Status: Single    Spouse Name: N/A  . Number of Children: N/A  . Years of Education: N/A   Occupational History  . Not on file.   Social History Main Topics  . Smoking status: Current Every Day Smoker -- 1.00 packs/day    Types: Cigarettes    Start date: 02/23/1990  . Smokeless tobacco: Not on file  . Alcohol Use: No  . Drug Use: Yes    Special: Marijuana     Comment: stopped smoking marijuana  . Sexual Activity: Not on file   Other Topics Concern  . Not on file   Social History Narrative    Review of Systems: Positive = bold Gen: Denies any fever, chills, rigors, night sweats, anorexia, fatigue, weakness, malaise, involuntary weight loss, and sleep disorder CV: Denies chest pain, angina, palpitations, syncope, orthopnea, PND, peripheral edema, and claudication. Resp: Denies dyspnea, cough, sputum, wheezing, coughing up blood. GI: Described in detail in HPI.    GU : Denies urinary burning, blood in urine, urinary frequency, urinary hesitancy, nocturnal urination, and urinary incontinence. MS: Denies joint pain or swelling.  Denies muscle weakness, cramps, atrophy.  Derm: Denies rash, itching, oral ulcerations, hives, unhealing ulcers.  Psych: Denies depression, anxiety, memory loss, suicidal ideation, hallucinations (history schizophrenia),  and confusion. Heme: Denies bruising, bleeding, and enlarged lymph nodes. Neuro:  Denies any headaches, dizziness, paresthesias. Endo:  Denies any problems with DM, thyroid, adrenal function.  Physical Exam: Vital signs in last 24 hours: Temp:  [97.6 F (36.4 C)] 97.6 F (36.4 C) (08/29 0532) Pulse Rate:  [63-116] 66 (08/29 1345) Resp:  [16-18]  16 (08/29 1314) BP: (105-129)/(66-85) 124/79 mmHg (08/29 1345) SpO2:  [95 %-100 %] 97 % (08/29 1345)   General:   Alert, thin and cachectic-appearing but is in no acute distress Head:  Normocephalic and atraumatic. Eyes:  Sclera clear, no icterus.   Conjunctiva pink. Ears:  Normal auditory acuity. Nose:  No deformity, discharge,  or lesions. Mouth:  No deformity or lesions.  Oropharynx pink & moist. Neck:  Supple; no masses or thyromegaly. Lungs:  Clear throughout to auscultation.   No wheezes, crackles, or rhonchi. No acute distress. Heart:  Regular rate and rhythm; no murmurs, clicks, rubs,  or gallops. Abdomen:  Soft, scaphoid, mild distended, hypoactive bowel sounds, old midline and right lower quadrant surgical scars. No masses, hepatosplenomegaly or hernias noted. No peritonitis     Msk:  Diffuse symmetrical muscular wasting without gross deformities. Normal posture. Pulses:  Normal pulses noted. Extremities:  Without clubbing or edema. Neurologic:  Alert and  oriented x4;  Diffusely weak, otherwise grossly normal neurologically. Skin:  Intact without significant  lesions or rashes. Psych:  Alert and cooperative. Depressed mood, flat affect   Lab Results:  Recent Labs  10/22/14 0548  WBC 9.3  HGB 15.5  HCT 44.6  PLT 322   BMET  Recent Labs  10/22/14 0548  NA 136  K 4.2  CL 98*  CO2 26  GLUCOSE 179*  BUN 6  CREATININE 0.94  CALCIUM 9.9   LFT  Recent Labs  10/22/14 0548  PROT 8.7*  ALBUMIN 3.8  AST 26  ALT 37  ALKPHOS 95  BILITOT 0.6   PT/INR No results for input(s): LABPROT, INR in the last 72 hours.  Studies/Results: Ct Abdomen Pelvis W Contrast  10/22/2014   CLINICAL DATA:  Umbilical pain for 2 months with nausea, vomiting, diarrhea for 2 days. History of Crohn's disease.  EXAM: CT ABDOMEN AND PELVIS WITH CONTRAST  TECHNIQUE: Multidetector CT imaging of the abdomen and pelvis was performed using the standard protocol following bolus administration  of intravenous contrast.  CONTRAST:  OMNIPAQUE IOHEXOL 300 MG/ML  SOLN  COMPARISON:  None.  FINDINGS: Lower chest:  Clear lung bases.  Normal heart size.  Hepatobiliary: Normal liver. No hepatic lesion. Distended gallbladder which may be secondary to prolonged fasting. Normal caliber common bile duct.  Pancreas: Normal.  Spleen: Normal.  Adrenals/Urinary Tract: Normal adrenal glands. Normal kidneys. No obstructive uropathy. Normal bladder.  Stomach/Bowel: Fluid-filled distended stomach. Small bowel dilatation with numerous fluid-filled loops of small bowel measuring up to 3.9 cm. Severe terminal ileal bowel wall thickening and surrounding inflammatory changes. Prior cecal surgery with surgical suture line present. No abdominal or pelvic free fluid. No pneumatosis, pneumoperitoneum or portal venous gas.  Vascular/Lymphatic: Normal caliber abdominal aorta. No abdominal or pelvic lymphadenopathy.  Other: No fluid collection or hematoma.  Musculoskeletal: No acute osseous abnormality. No lytic or sclerotic osseous lesion.  IMPRESSION: 1. Severe terminal ileal bowel wall thickening and surrounding inflammatory changes most consistent with terminal ileitis as can be seen with Crohn's disease given the patient's history. This is resulting in small bowel obstruction with significant small bowel dilatation and gastric distension.   Electronically Signed   By: Elige Ko   On: 10/22/2014 10:16   Dg Chest Portable 1 View  10/22/2014   CLINICAL DATA:  Nasogastric tube insertion.  EXAM: PORTABLE CHEST - 1 VIEW  COMPARISON:  10/17/2008  FINDINGS: There has been placement of enteric catheter coiled on itself over the expected location of gastric body. A second radiopaque catheter is seen transversing the midline of thorax and upper abdomen, with tip collimated off the image.  Cardiomediastinal silhouette is normal. Mediastinal contours appear intact.  There is no evidence of focal airspace consolidation, pleural  effusion or pneumothorax.  Osseous structures are without acute abnormality. Residual contrast is seen within the collecting system of both kidneys. There is gaseous distention of the visualized small bowel loops. Soft tissues are grossly normal.  IMPRESSION: Enteric catheter with distal tip seen overlying the expected location of gastric body.  Second catheter transversing the thorax with tip collimated off the image.  Gaseous distention of small bowel loops seen in the upper abdomen, similar to previously seen small bowel obstruction, demonstrated by CT from the same date.   Electronically Signed   By: Ted Mcalpine M.D.   On: 10/22/2014 11:48   Impression:  1.  Small bowel obstruction.  Likely from active Crohn's disease. 2.  Nausea and vomiting, likely from #1 above. 3.  Abnormal CT scan abdomen/pelvis, likely reflective of  acute Crohn's flare complicated by small bowel obstruction. 4.  Weight loss, abnormal, likely from decreased peroral intake over the past couple months in turn likely due to smoldering and progressively worsening Crohn's disease.  Plan:  1.  NPO for now except possibly sips few ice chips. 2.  Continue nasogastric tube. 3.  Intravenous steroids (Solumedrol 40 mg IV bid). 4.  Check stool studies, including C. Diff. 5.  Patient might ultimately benefit from repeat colonoscopy, pending improvement of his small bowel obstruction symptoms such that he could tolerate bowel preparation. 6.  Patient likely ultimately might benefit from biologic therapy (such as Remicade or Humira) or immunomodulator therapy, but his group home residence and schizophrenia are going to provide a lot of logistic hurdles. 7.  Will follow.   LOS: 0 days   Joene Gelder M  10/22/2014, 2:35 PM  Pager [phone] If no answer or after 5 PM call [phone]

## 2014-10-22 NOTE — H&P (Signed)
Family Medicine Teaching [facility] Admission History and Physical Service Pager: [phone]  Patient name: Larry Leonard Medical record number: [id number] Date of birth: [birth year] Age: 39 y.o. Gender: male  Primary Care Provider: No PCP Per Patient Consultants: Gastroenterology, General Surgery Code Status: FULL  Chief Complaint: Abdominal pain, nausea and vomiting  Assessment and Plan: Larry Leonard is a 39 y.o. male presenting with two months of progressive abdominal pain, nausea and vomiting with imaging concerning for small bowel obstruction. PMH is significant for Chron's disease and paranoid schizophrenia.  Crohn's Disease with Small Bowel Obstruction: History of symptomatic Crohn's disease with chronic pain and diarrhea for greater than 20 years. He reports prior appendectomy with what he describes as concurrent partial colectomy and colostomy at age 49-15 at [facility] in [place]; colostomy subsequently reversed. Otherwise, denies prior complications or hospitalizations related to his disease. Not currently managed by PCP or Gastroenterologist on an outpatient basis. He has previously been on multiple medications for his disease, though has not been on any disease-modifying agents for several years. Today, CT with severe terminal ileal bowel wall thickening and surrounding inflammatory changes most consistent with terminal ileitis as can be seen with Crohn's disease, resulting in small bowel obstruction with significant small bowel obstruction. Small bowel dilation up to 3.9 cm; no signs of rupture/free air on imaging. SBO likely attributable to inflammatory changes secondary to Crohn's disease or possibly to adhesions from prior abdominal surgery. - Surgery and GI following, will continue to follow recommendations and appreciate their assistance in management. - Per GI, will check stool studies, including C. Diff; however, patient with history of chronic diarrhea unchanged from  baseline. - Solumedrol 40 mg IV BID - phenergan IV PRN - NGT with intermittent suction - IVF: D51/2 NS @ 100 mL/hr - NPO - Prealbumin to assess nutritional status - Per GI, consider repeat colonoscopy pending improvement of SBO symptoms. May also ultimately benefit from biologic therapy such as Remicade or Humira, or from immunomodulator therapy.  Paranoid Schizophrenia: Patient reports that he is currently managed by Dr. Iantha Fallen on an outpatient basis. Patient with somewhat flat affect, likely attributable to what he describes as excruciating pain at present. No signs of response to internal stimuli at present. - Continue home olanzapine 10 mg ghs and quetiapine 50 mg qhs. Will clamp NG tube to administer nightly. Also receives aripiprazole 400 mg IM q month on an outpatient basis. - Will continue to monitor.  Tobacco Use: Current 1 PPD smoker; since age 52-15. Endorses some prior quit attempts - Encouraged cessation  Prior Alcohol Abuse: Reports prior abuse with only occasional alcohol use at present. Holding home naltrexone during admission.  Urine with trace leukocytes: Now s/p 1 dose ceftriaxone 1g IV in the ED for presumed UTI. Denies dysuria, hematuria, other urinary symptoms at present and UA appears to be indicative of dehydration; low suspicion for UTI at present, but will continue to monitor.  FEN/GI: NPO, may have some ice chips for comfort and sips with meds for nightly meds Prophylaxis: Lovenox  Disposition: Admit for further inpatient management.  History of Present Illness: Larry Leonard is a 39 y.o. male with history of Crohn's disease and paranoid schizophrenia presenting with two months of progressive abdominal pain. Reports that pain is constant, sharp and "excruciating." Present diffusely, though most severe at the umbilicus. This pain has been progressively worsening over the past two months and acutely worsened over the past two days. Reports associated nausea and  vomiting that  started last night; non-bloody and non-bilious emesis that is Suell in color. Also endorses new-onset constipation with most recent BM 48 hours ago; this is a change from his chronic diarrhea.  Review Of Systems: Per HPI with the following additions: denies dysphagia, dysuria, and hematuria. Otherwise 12 point review of systems was performed and was unremarkable.  Patient Active Problem List   Diagnosis Date Noted  . Tobacco use 10/22/2014  . SBO (small bowel obstruction) 10/22/2014   Past Medical History: Past Medical History  Diagnosis Date  . Crohn's disease   . Paranoid schizophrenia   . Tobacco use 10/22/2014   Past Surgical History: Past Surgical History  Procedure Laterality Date  . Colon surgery     Social History: Social History  Substance Use Topics  . Smoking status: Current Every Day Smoker -- 1.00 packs/day    Types: Cigarettes    Start date: 02/23/1990  . Smokeless tobacco: None  . Alcohol Use: No   Additional social history: Patient currently resides with [facility]. Prior history of alcohol abuse, on Naltrexone; reports that he rarely drinks 1 small can of beer. Also reports prior marijuana use, though no current use of recreational drugs. Please also refer to relevant sections of EMR.  Family History: Denies family history of Chron's disease or other bowel disease.  Allergies and Medications: No Known Allergies   No current facility-administered medications on file prior to encounter.   Current Outpatient Prescriptions on File Prior to Encounter  Medication Sig Dispense Refill  . HYDROcodone-acetaminophen (NORCO/VICODIN) 5-325 MG per tablet Take 1 tablet by mouth every 4 (four) hours as needed for moderate pain or severe pain. (Patient not taking: Reported on 10/22/2014) 10 tablet 0    Objective: BP 124/79 mmHg  Pulse 66  Temp(Src) 97.6 F (36.4 C) (Oral)  Resp 16  SpO2 97% Exam: General: Thin, apparently malnourished man lying in  bed. NAD, though in visible discomfort. HEENT: Normocephalic and atraumatic. Conjunctivae clear, sclera anicteric, and tearing noted from both eyes. NG tube in place. Oropharynx pink and moist without lesions. Good dentition.  Neck: Supple with full range of motion. Cardiovascular: RRR without murmur/rub/gallop. DP and radial pulses 2+ bilaterally. Respiratory: Normal work of breathing on room air. Lungs clear to auscultation bilaterally without crackle/wheeze/rhonchi. Abdomen: Soft, scaphoid, mildly-distended abdomen with surgical scars noted at the midline and RLQ. Diffuse tenderness to palpation, most notable over the umbilicus. No masses appreciated. MSK: Thin with diffuse muscle atrophy and deconditioning. Skin: No rashes or lesions appreciated. Neuro: Alert and oriented x3. No focal deficits appreciated. Psych: Low mood and flat affect. Did not appear to be responding to internal stimuli.  Labs and Imaging: CBC BMET   Recent Labs Lab 10/22/14 0548  WBC 9.3  HGB 15.5  HCT 44.6  PLT 322    Recent Labs Lab 10/22/14 0548  NA 136  K 4.2  CL 98*  CO2 26  BUN 6  CREATININE 0.94  GLUCOSE 179*  CALCIUM 9.9      UA: amber, hazy, SG 1.034, +ketones, trace leukocytes, negative nitrites Urine Microscopy: 3-6 WBCs, many bacteria, granular casts, Ca oxalate crystals Urine Culture: Pending  Lipase: 16 (WNL)    Ct Abdomen Pelvis W Contrast  10/22/2014   CLINICAL DATA:  Umbilical pain for 2 months with nausea, vomiting, diarrhea for 2 days. History of Crohn's disease.  EXAM: CT ABDOMEN AND PELVIS WITH CONTRAST  TECHNIQUE: Multidetector CT imaging of the abdomen and pelvis was performed using the standard protocol following  bolus administration of intravenous contrast.  CONTRAST:  OMNIPAQUE IOHEXOL 300 MG/ML  SOLN  COMPARISON:  None.  FINDINGS: Lower chest:  Clear lung bases.  Normal heart size.  Hepatobiliary: Normal liver. No hepatic lesion. Distended gallbladder which may be  secondary to prolonged fasting. Normal caliber common bile duct.  Pancreas: Normal.  Spleen: Normal.  Adrenals/Urinary Tract: Normal adrenal glands. Normal kidneys. No obstructive uropathy. Normal bladder.  Stomach/Bowel: Fluid-filled distended stomach. Small bowel dilatation with numerous fluid-filled loops of small bowel measuring up to 3.9 cm. Severe terminal ileal bowel wall thickening and surrounding inflammatory changes. Prior cecal surgery with surgical suture line present. No abdominal or pelvic free fluid. No pneumatosis, pneumoperitoneum or portal venous gas.  Vascular/Lymphatic: Normal caliber abdominal aorta. No abdominal or pelvic lymphadenopathy.  Other: No fluid collection or hematoma.  Musculoskeletal: No acute osseous abnormality. No lytic or sclerotic osseous lesion.  IMPRESSION: 1. Severe terminal ileal bowel wall thickening and surrounding inflammatory changes most consistent with terminal ileitis as can be seen with Crohn's disease given the patient's history. This is resulting in small bowel obstruction with significant small bowel dilatation and gastric distension.   Electronically Signed   By: Elige Ko   On: 10/22/2014 10:16   Dg Chest Portable 1 View  10/22/2014   CLINICAL DATA:  Nasogastric tube insertion.  EXAM: PORTABLE CHEST - 1 VIEW  COMPARISON:  10/17/2008  FINDINGS: There has been placement of enteric catheter coiled on itself over the expected location of gastric body. A second radiopaque catheter is seen transversing the midline of thorax and upper abdomen, with tip collimated off the image.  Cardiomediastinal silhouette is normal. Mediastinal contours appear intact.  There is no evidence of focal airspace consolidation, pleural effusion or pneumothorax.  Osseous structures are without acute abnormality. Residual contrast is seen within the collecting system of both kidneys. There is gaseous distention of the visualized small bowel loops. Soft tissues are grossly normal.   IMPRESSION: Enteric catheter with distal tip seen overlying the expected location of gastric body.  Second catheter transversing the thorax with tip collimated off the image.  Gaseous distention of small bowel loops seen in the upper abdomen, similar to previously seen small bowel obstruction, demonstrated by CT from the same date.   Electronically Signed   By: Ted Mcalpine M.D.   On: 10/22/2014 11:48    Melford Aase, Med Student 10/22/2014, 2:43 PM MS4, [facility] FPTS Intern pager: [phone], text pages welcome  RESIDENT ADDENDUM I have separately seen and examined the patient. I have discussed the findings and exam with the medical student and agree with the above note. Additionally I have outlined my exam and assessment/plan below:  PE: General: NAD HEENT: PERRL, EOMI. MMM. NGT in place CV: RRR, nl s1s2 no mrg. 2+ radial, PT pulses bilaterally. No LE edema Resp: CTAB, nl effort Abd: thin, soft, mildly tender epigastrium, nondistended. Bowel sounds minimal.  Extremities: thin muscle bulk, no deformities Neuro: alert and oriented x 3, no deficits noted Skin: well healed surgical scars in abdomen, no rashes.  A/P: Larry Leonard is a 39 y.o. male admitted for abdominal pain and SBO. PMH significant for Crohn's disease and tobacco abuse  # Crohn's Disease with Small Bowel Obstruction: abdominal pain requiring dilaudid in ED. CT abd with evidence of crohn's and SBO. NGT placed in ED. GI and general surgery were consulted in ED. - NGT with intermittent suction - solumedrol per GI - phenergan PRN - appreciate GI  and gen surgery recs - stool studies  # Paranoid Schizophrenia: stable currently - continue seroquel and zyprexa qHS  # FEN/GI: - D5 1/2NS at 100cc/hr while NPO - lovenox  Dispo: admit  Tawni Carnes, MD 10/22/2014, 5:00 PM PGY-3, [facility] Family Medicine FPTS Intern Pager: [phone], text pages welcome

## 2014-10-22 NOTE — ED Notes (Signed)
Pt arrives via [facility] for abdominal pain x2 months. Pt attempting to induce vomiting in triage. Pt reports nausea. States worse today.

## 2014-10-22 NOTE — Consult Note (Signed)
Reason for Consult:  SBO Referring Physician: Julious Oka  (ED)  Larry Leonard is an 39 y.o. male.  HPI: Pt with a long psychiatric history of schizophrenia and Crohn's disease who lives in a group home.  He presents today with reported abdominal pain for the last 2 months, he notes he has been constipated trouble with any BM.  He has not been eating anything except noodles and some candy.  Yesterday after eating he developed pain that he said was 10/10.   He reported nausea, vomiting diarrhea with non bloody, non bilious emesis. He tride to induce vomiting in triage area here in the hospital.  He cannot remember when he was last treated for his Crohn's disease.  He does not know what medicines he is on at the home.  He currently resides in a group home for his schizophrenia.    Work up shows he has been here since about 5 this AM.  Afebrile, VSS.  Glucose is up but CMP is normal, lipase is normal.  Possible UTI.  CT scan shows the stomach is distended and SBO with SB up to 3.9 CM.  Severe terminal ileal bowel wall thickening and surrounding inflammatory changes. Prior cecal surgery with surgical suture line present. No abdominal or pelvic free fluid. No pneumatosis, pneumoperitoneum or portal venous gas.    Past Medical History  Diagnosis Date  Crohn's disease No treatment for some time.  Paranoid schizophrenia      Tobacco use   Weight Loss        Past Surgical History  Procedure Laterality Date  . Colon surgery  Sounds like hemicolectomy at age 6-15, with colostomy. Acute ruptured appendix packed open wet to dry Colostomy reversal all around theage of [age].  Age [age], not sure when.  Done at [facility] in [place].    No family history on file.  Social History:  reports that he has been smoking Cigarettes.  He has been smoking about 0.50 packs per day. He does not have any smokeless tobacco history on file. He reports that he does not drink alcohol. His drug history is not on  file. Tobacco:  Ongoing use ETOH:  None DRUGS:  none  Allergies: No Known Allergies  Medications:  Prior to Admission medications   Medication Sig Start Date End Date Taking? Authorizing Provider  albuterol (PROVENTIL HFA;VENTOLIN HFA) 108 (90 BASE) MCG/ACT inhaler Inhale 1 puff into the lungs every 6 (six) hours as needed for wheezing or shortness of breath.   Yes Historical Provider, MD  ARIPiprazole (ABILIFY MAINTENA) 400 MG SUSR Inject 400 mg into the muscle every 30 (thirty) days.   Yes Historical Provider, MD  docusate sodium (COLACE) 100 MG capsule Take 100 mg by mouth daily.   Yes Historical Provider, MD  naltrexone (DEPADE) 50 MG tablet Take 50 mg by mouth daily.   Yes Historical Provider, MD  naproxen (NAPROSYN) 500 MG tablet Take 500 mg by mouth 2 (two) times daily as needed for moderate pain.    Yes Historical Provider, MD  OLANZapine (ZYPREXA) 10 MG tablet Take 10 mg by mouth at bedtime.   Yes Historical Provider, MD  QUEtiapine (SEROQUEL) 50 MG tablet Take 50 mg by mouth at bedtime.   Yes Historical Provider, MD  HYDROcodone-acetaminophen (NORCO/VICODIN) 5-325 MG per tablet Take 1 tablet by mouth every 4 (four) hours as needed for moderate pain or severe pain. Patient not taking: Reported on 10/22/2014 12/14/13   Clayton Bibles, PA-C  Prior to Admission:   Pt does not know what he is on at Group home   Anti-infectives    Start     Dose/Rate Route Frequency Ordered Stop   10/22/14 1045  cefTRIAXone (ROCEPHIN) 1 g in dextrose 5 % 50 mL IVPB     1 g 100 mL/hr over 30 Minutes Intravenous  Once 10/22/14 1034 10/22/14 1125      Results for orders placed or performed during the hospital encounter of 10/22/14 (from the past 48 hour(s))  Lipase, blood     Status: Abnormal   Collection Time: 10/22/14  5:48 AM  Result Value Ref Range   Lipase 16 (L) 22 - 51 U/L  Comprehensive metabolic panel     Status: Abnormal   Collection Time: 10/22/14  5:48 AM  Result Value Ref Range    Sodium 136 135 - 145 mmol/L   Potassium 4.2 3.5 - 5.1 mmol/L   Chloride 98 (L) 101 - 111 mmol/L   CO2 26 22 - 32 mmol/L   Glucose, Bld 179 (H) 65 - 99 mg/dL   BUN 6 6 - 20 mg/dL   Creatinine, Ser 0.94 0.61 - 1.24 mg/dL   Calcium 9.9 8.9 - 10.3 mg/dL   Total Protein 8.7 (H) 6.5 - 8.1 g/dL   Albumin 3.8 3.5 - 5.0 g/dL   AST 26 15 - 41 U/L   ALT 37 17 - 63 U/L   Alkaline Phosphatase 95 38 - 126 U/L   Total Bilirubin 0.6 0.3 - 1.2 mg/dL   GFR calc non Af Amer >60 >60 mL/min   GFR calc Af Amer >60 >60 mL/min    Comment: (NOTE) The eGFR has been calculated using the CKD EPI equation. This calculation has not been validated in all clinical situations. eGFR's persistently <60 mL/min signify possible Chronic Kidney Disease.    Anion gap 12 5 - 15  CBC     Status: None   Collection Time: 10/22/14  5:48 AM  Result Value Ref Range   WBC 9.3 4.0 - 10.5 K/uL   RBC 5.70 4.22 - 5.81 MIL/uL   Hemoglobin 15.5 13.0 - 17.0 g/dL   HCT 44.6 39.0 - 52.0 %   MCV 78.2 78.0 - 100.0 fL   MCH 27.2 26.0 - 34.0 pg   MCHC 34.8 30.0 - 36.0 g/dL   RDW 15.1 11.5 - 15.5 %   Platelets 322 150 - 400 K/uL  Urinalysis, Routine w reflex microscopic (not at [facility])     Status: Abnormal   Collection Time: 10/22/14  8:23 AM  Result Value Ref Range   Color, Urine AMBER (A) YELLOW    Comment: BIOCHEMICALS MAY BE AFFECTED BY COLOR   APPearance HAZY (A) CLEAR   Specific Gravity, Urine 1.034 (H) 1.005 - 1.030   pH 5.5 5.0 - 8.0   Glucose, UA NEGATIVE NEGATIVE mg/dL   Hgb urine dipstick NEGATIVE NEGATIVE   Bilirubin Urine SMALL (A) NEGATIVE   Ketones, ur 15 (A) NEGATIVE mg/dL   Protein, ur 30 (A) NEGATIVE mg/dL   Urobilinogen, UA 0.2 0.0 - 1.0 mg/dL   Nitrite NEGATIVE NEGATIVE   Leukocytes, UA TRACE (A) NEGATIVE  Urine microscopic-add on     Status: Abnormal   Collection Time: 10/22/14  8:23 AM  Result Value Ref Range   Squamous Epithelial / LPF FEW (A) RARE   WBC, UA 3-6 <3 WBC/hpf   Bacteria, UA MANY (A) RARE    Casts GRANULAR CAST (A) NEGATIVE  Crystals CA OXALATE CRYSTALS (A) NEGATIVE   Urine-Other MUCOUS PRESENT     Ct Abdomen Pelvis W Contrast  10/22/2014   CLINICAL DATA:  Umbilical pain for 2 months with nausea, vomiting, diarrhea for 2 days. History of Crohn's disease.  EXAM: CT ABDOMEN AND PELVIS WITH CONTRAST  TECHNIQUE: Multidetector CT imaging of the abdomen and pelvis was performed using the standard protocol following bolus administration of intravenous contrast.  CONTRAST:  160m OMNIPAQUE IOHEXOL 300 MG/ML  SOLN  COMPARISON:  None.  FINDINGS: Lower chest:  Clear lung bases.  Normal heart size.  Hepatobiliary: Normal liver. No hepatic lesion. Distended gallbladder which may be secondary to prolonged fasting. Normal caliber common bile duct.  Pancreas: Normal.  Spleen: Normal.  Adrenals/Urinary Tract: Normal adrenal glands. Normal kidneys. No obstructive uropathy. Normal bladder.  Stomach/Bowel: Fluid-filled distended stomach. Small bowel dilatation with numerous fluid-filled loops of small bowel measuring up to 3.9 cm. Severe terminal ileal bowel wall thickening and surrounding inflammatory changes. Prior cecal surgery with surgical suture line present. No abdominal or pelvic free fluid. No pneumatosis, pneumoperitoneum or portal venous gas.  Vascular/Lymphatic: Normal caliber abdominal aorta. No abdominal or pelvic lymphadenopathy.  Other: No fluid collection or hematoma.  Musculoskeletal: No acute osseous abnormality. No lytic or sclerotic osseous lesion.  IMPRESSION: 1. Severe terminal ileal bowel wall thickening and surrounding inflammatory changes most consistent with terminal ileitis as can be seen with Crohn's disease given the patient's history. This is resulting in small bowel obstruction with significant small bowel dilatation and gastric distension.   Electronically Signed   By: HKathreen Devoid  On: 10/22/2014 10:16   Dg Chest Portable 1 View  10/22/2014   CLINICAL DATA:  Nasogastric tube  insertion.  EXAM: PORTABLE CHEST - 1 VIEW  COMPARISON:  10/17/2008  FINDINGS: There has been placement of enteric catheter coiled on itself over the expected location of gastric body. A second radiopaque catheter is seen transversing the midline of thorax and upper abdomen, with tip collimated off the image.  Cardiomediastinal silhouette is normal. Mediastinal contours appear intact.  There is no evidence of focal airspace consolidation, pleural effusion or pneumothorax.  Osseous structures are without acute abnormality. Residual contrast is seen within the collecting system of both kidneys. There is gaseous distention of the visualized small bowel loops. Soft tissues are grossly normal.  IMPRESSION: Enteric catheter with distal tip seen overlying the expected location of gastric body.  Second catheter transversing the thorax with tip collimated off the image.  Gaseous distention of small bowel loops seen in the upper abdomen, similar to previously seen small bowel obstruction, demonstrated by CT from the same date.   Electronically Signed   By: DFidela SalisburyM.D.   On: 10/22/2014 11:48    Review of Systems  Constitutional: Positive for weight loss (30 pounds over the last 2 months). Negative for malaise/fatigue and diaphoresis.  HENT: Negative.   Eyes: Negative.   Cardiovascular: Negative.   Gastrointestinal: Positive for nausea, vomiting, abdominal pain, diarrhea and constipation.  Genitourinary: Negative.   Musculoskeletal: Negative.   Skin: Negative.   Neurological: Negative.  Negative for weakness.  Endo/Heme/Allergies: Negative.   Psychiatric/Behavioral: Negative.        He reports he is on meds at the home but he does not know what they are.   Blood pressure 121/75, pulse 74, temperature 97.6 F (36.4 C), temperature source Oral, resp. rate 16, SpO2 98 %. Physical Exam  Constitutional: He is oriented to person, place, and  time.  Thin cachetic male with NG in place but no acute  distress.  He appears cachectic and notes large weight loss since onset of symptoms.  HENT:  Head: Normocephalic and atraumatic.  NG in place  Eyes: Conjunctivae and EOM are normal. Right eye exhibits no discharge. Left eye exhibits no discharge. No scleral icterus.  Neck: Normal range of motion. Neck supple. No JVD present. No tracheal deviation present. No thyromegaly present.  Cardiovascular: Normal rate, regular rhythm, normal heart sounds and intact distal pulses.   No murmur heard. Respiratory: Effort normal and breath sounds normal. No respiratory distress. He has no wheezes. He has no rales. He exhibits no tenderness.  GI: Soft. He exhibits distension. He exhibits no mass. There is no tenderness. There is no rebound and no guarding.  He has a midline incision, a RLQ incision, and an ostomy site that has been reversed.  Musculoskeletal: He exhibits no edema or tenderness.  Lymphadenopathy:    He has no cervical adenopathy.  Neurological: He is alert and oriented to person, place, and time. No cranial nerve deficit.  Skin: Skin is warm and dry. No rash noted. No erythema. No pallor.  Psychiatric: He has a normal mood and affect. His behavior is normal. Judgment and thought content normal.    Assessment/Plan: SBO Hx of Crohn's disease with probable partial colectomy, colostomy, open appendectomy and colostomy reversal at [facility] in [place], [place].  Age [age]. Severe terminal ileal bowel wall thickening and surrounding inflammatory changes on current CT No treatment for Crohn's disease in some years per his history. (no records available currently) Weight loss/malnutrition Deconditioning ( pt notes they watch TV at home, but no sports type activity) Tobacco Use Paranoid Schizophrenia  -  Resides in group home with prior hospitalizations for suicidal ideations. Possible UTI  Plan:  I agree with NG placement.  IV hydration, bowel rest.  He needs GI consult, for evaluation of his  Crohn's disease. Antibiotics for possible UTI.  I have ordered prealbumin, labs in AM and AM film.  I cannot tell if this is Crohn's with obstruction, or SBO from adhesions, or some of both.  I think by the history it sounds more like Crohn's.  We will follow with you.     Yamaris Cummings 10/22/2014, 12:03 PM

## 2014-10-23 ENCOUNTER — Inpatient Hospital Stay (HOSPITAL_COMMUNITY): Payer: Medicare Other

## 2014-10-23 DIAGNOSIS — Z720 Tobacco use: Secondary | ICD-10-CM

## 2014-10-23 DIAGNOSIS — K50119 Crohn's disease of large intestine with unspecified complications: Secondary | ICD-10-CM

## 2014-10-23 DIAGNOSIS — E43 Unspecified severe protein-calorie malnutrition: Secondary | ICD-10-CM | POA: Insufficient documentation

## 2014-10-23 DIAGNOSIS — K5669 Other intestinal obstruction: Secondary | ICD-10-CM

## 2014-10-23 LAB — URINE CULTURE: CULTURE: NO GROWTH

## 2014-10-23 LAB — BASIC METABOLIC PANEL
Anion gap: 10 (ref 5–15)
BUN: 17 mg/dL (ref 6–20)
CO2: 28 mmol/L (ref 22–32)
CREATININE: 0.86 mg/dL (ref 0.61–1.24)
Calcium: 8.9 mg/dL (ref 8.9–10.3)
Chloride: 98 mmol/L — ABNORMAL LOW (ref 101–111)
GFR calc Af Amer: >60 mL/min (ref >60)
Glucose, Bld: 141 mg/dL — ABNORMAL HIGH (ref 65–99)
Potassium: 3.6 mmol/L (ref 3.5–5.1)
SODIUM: 136 mmol/L (ref 135–145)

## 2014-10-23 LAB — HIV ANTIBODY (ROUTINE TESTING W REFLEX): HIV Screen 4th Generation wRfx: NONREACTIVE

## 2014-10-23 LAB — VITAMIN B12: VITAMIN B 12: 324 pg/mL (ref 180–914)

## 2014-10-23 LAB — FOLATE: Folate: 12.8 ng/mL (ref >5.9)

## 2014-10-23 NOTE — Progress Notes (Signed)
Initial Nutrition Assessment  DOCUMENTATION CODES:   Severe malnutrition in context of acute illness/injury  INTERVENTION:   -RD will follow for diet advancement and supplement diet as appropriate -If prolonged NPO status is expected, consider initiation of nutrition support  NUTRITION DIAGNOSIS:   Malnutrition related to acute illness as evidenced by moderate depletion of body fat, moderate depletions of muscle mass, percent weight loss.  GOAL:   Patient will meet greater than or equal to 90% of their needs  MONITOR:   Diet advancement, PO intake, Supplement acceptance, Labs, Weight trends, Skin, I & O's  REASON FOR ASSESSMENT:   Consult Assessment of nutrition requirement/status  ASSESSMENT:   Larry Leonard is a 39 y.o. male presenting with two months of progressive abdominal pain, nausea and vomiting with imaging concerning for small bowel obstruction. PMH is significant for Chron's disease and paranoid schizophrenia.  Pt admitted with SBO.   Pt currently NPO for bowel rest. Pt with NGT connected to low, intermittent suction. Noted 900 ml output this shift within the past 24 hours per doc flowsheets.   Pt reports he was in his normal state of health prior to the last 2 months, where he experienced a general decline in health, including poor appetite and weight loss. Pt reports he was eating soft foods, such as ham and spaghetti, but was eating smaller portions due to poor appetite and abdominal pain. He reports his UBW is 131# and he estimates a 35# wt loss over the past 2 months. Wt was verified using bedscale, as last recorded wt was taken on 12/14/13; bedscale read wt of 115#, which reveals a 12% wt loss over the past 2 months.   Pt reports his clothes have been fitting looser and has noticed signs of fat and muscle depletion ("I can see my ribs now"). Nutrition-Focused physical exam completed. Findings are mild to moderate fat depletion, mild to moderate muscle depletion,  and no edema.   Pt understands rationale for NPO order, however, is agreeable to supplements to maximize PO intake once diet is advanced.   Labs reviewed.   Diet Order:  Diet NPO time specified Except for: Ice Chips  Skin:  Reviewed, no issues  Last BM:  PTA  Height:   Ht Readings from Last 1 Encounters:  10/23/14  (1.676 m)    Weight:   Wt Readings from Last 1 Encounters:  10/23/14 115 lb (52.164 kg)    Ideal Body Weight:  64.5 kg  BMI:  Body mass index is 18.57 kg/(m^2).  Estimated Nutritional Needs:   Kcal:  1550-1750  Protein:  65-75 grams  Fluid:  >1.5 L  EDUCATION NEEDS:   Education needs addressed  Oseias Horsey A. Mayford Knife, RD, LDN, CDE Pager: [phone] After hours Pager: [phone]

## 2014-10-23 NOTE — Progress Notes (Signed)
[facility] Progress Note     Subjective: Pt says pain and distension improved.  No N/V.  No flatus yet today.  No more diarrhea.  I showed him his xrays and explained why he has the NG tube.  No other concerns.  Objective: Vital signs in last 24 hours: Temp:  [98.1 F (36.7 C)-98.5 F (36.9 C)] 98.5 F (36.9 C) (08/30 0642) Pulse Rate:  [63-116] 65 (08/30 0642) Resp:  [16-18] 16 (08/30 0642) BP: (106-129)/(67-85) 118/77 mmHg (08/30 0642) SpO2:  [95 %-100 %] 99 % (08/30 0642)    Intake/Output from previous day: 08/29 0701 - 08/30 0700 In: 1253.3 [P.O.:30; I.V.:1223.3] Out: 900 [Emesis/NG output:900] Intake/Output this shift:    PE: Gen:  Alert, NAD, pleasant Card:  RRR, no M/G/R heard Pulm:  CTA, no W/R/R Abd: Soft, NT, mildly distended, +BS, no HSM, large midline abdominal scar noted   Lab Results:   Recent Labs  10/22/14 0548  WBC 9.3  HGB 15.5  HCT 44.6  PLT 322   BMET  Recent Labs  10/22/14 0548 10/23/14 0318  NA 136 136  K 4.2 3.6  CL 98* 98*  CO2 26 28  GLUCOSE 179* 141*  BUN 6 17  CREATININE 0.94 0.86  CALCIUM 9.9 8.9   PT/INR No results for input(s): LABPROT, INR in the last 72 hours. CMP     Component Value Date/Time   NA 136 10/23/2014 0318   K 3.6 10/23/2014 0318   CL 98* 10/23/2014 0318   CO2 28 10/23/2014 0318   GLUCOSE 141* 10/23/2014 0318   BUN 17 10/23/2014 0318   CREATININE 0.86 10/23/2014 0318   CALCIUM 8.9 10/23/2014 0318   PROT 8.7* 10/22/2014 0548   ALBUMIN 3.8 10/22/2014 0548   AST 26 10/22/2014 0548   ALT 37 10/22/2014 0548   ALKPHOS 95 10/22/2014 0548   BILITOT 0.6 10/22/2014 0548   GFRNONAA >60 10/23/2014 0318   GFRAA >60 10/23/2014 0318   Lipase     Component Value Date/Time   LIPASE 16* 10/22/2014 0548       Studies/Results: Ct Abdomen Pelvis W Contrast  10/22/2014   CLINICAL DATA:  Umbilical pain for 2 months with nausea, vomiting, diarrhea for 2 days. History of Crohn's disease.  EXAM:  CT ABDOMEN AND PELVIS WITH CONTRAST  TECHNIQUE: Multidetector CT imaging of the abdomen and pelvis was performed using the standard protocol following bolus administration of intravenous contrast.  CONTRAST:  OMNIPAQUE IOHEXOL 300 MG/ML  SOLN  COMPARISON:  None.  FINDINGS: Lower chest:  Clear lung bases.  Normal heart size.  Hepatobiliary: Normal liver. No hepatic lesion. Distended gallbladder which may be secondary to prolonged fasting. Normal caliber common bile duct.  Pancreas: Normal.  Spleen: Normal.  Adrenals/Urinary Tract: Normal adrenal glands. Normal kidneys. No obstructive uropathy. Normal bladder.  Stomach/Bowel: Fluid-filled distended stomach. Small bowel dilatation with numerous fluid-filled loops of small bowel measuring up to 3.9 cm. Severe terminal ileal bowel wall thickening and surrounding inflammatory changes. Prior cecal surgery with surgical suture line present. No abdominal or pelvic free fluid. No pneumatosis, pneumoperitoneum or portal venous gas.  Vascular/Lymphatic: Normal caliber abdominal aorta. No abdominal or pelvic lymphadenopathy.  Other: No fluid collection or hematoma.  Musculoskeletal: No acute osseous abnormality. No lytic or sclerotic osseous lesion.  IMPRESSION: 1. Severe terminal ileal bowel wall thickening and surrounding inflammatory changes most consistent with terminal ileitis as can be seen with Crohn's disease given the patient's history. This is resulting in small  bowel obstruction with significant small bowel dilatation and gastric distension.   Electronically Signed   By: Elige Ko   On: 10/22/2014 10:16   Dg Chest Portable 1 View  10/22/2014   CLINICAL DATA:  Nasogastric tube insertion.  EXAM: PORTABLE CHEST - 1 VIEW  COMPARISON:  10/17/2008  FINDINGS: There has been placement of enteric catheter coiled on itself over the expected location of gastric body. A second radiopaque catheter is seen transversing the midline of thorax and upper abdomen, with tip  collimated off the image.  Cardiomediastinal silhouette is normal. Mediastinal contours appear intact.  There is no evidence of focal airspace consolidation, pleural effusion or pneumothorax.  Osseous structures are without acute abnormality. Residual contrast is seen within the collecting system of both kidneys. There is gaseous distention of the visualized small bowel loops. Soft tissues are grossly normal.  IMPRESSION: Enteric catheter with distal tip seen overlying the expected location of gastric body.  Second catheter transversing the thorax with tip collimated off the image.  Gaseous distention of small bowel loops seen in the upper abdomen, similar to previously seen small bowel obstruction, demonstrated by CT from the same date.   Electronically Signed   By: Ted Mcalpine M.D.   On: 10/22/2014 11:48   Dg Abd 2 Views  10/23/2014   CLINICAL DATA:  Small bowel obstruction and nasogastric tube placement.  EXAM: ABDOMEN - 2 VIEW  COMPARISON:  Abdominal CT and radiograph from yesterday  FINDINGS: New nasogastric tube with tip in the distal stomach. Fluid levels and paucity of lower abdominal gas which correlate with dilated small bowel on previous abdominal CT. No indication of pneumatosis or perforation. Normal unintentional cystogram.  IMPRESSION: 1. Unchanged bowel gas pattern, partial small bowel obstruction likely persists. 2. Nasogastric tube remains in good position.   Electronically Signed   By: Marnee Spring M.D.   On: 10/23/2014 07:53    Anti-infectives: Anti-infectives    Start     Dose/Rate Route Frequency Ordered Stop   10/22/14 1045  cefTRIAXone (ROCEPHIN) 1 g in dextrose 5 % 50 mL IVPB     1 g 100 mL/hr over 30 Minutes Intravenous  Once 10/22/14 1034 10/22/14 1125       Assessment/Plan SBO -Medical management including bowel rest, NG tube, IVF, pain control, antiemetics -Not ready for clamping trial or diet -Would avoid surgery in this acute flair setting -Ambulate and  IS Crohn's disease  -Probable partial colectomy, colostomy, open appendectomy and colostomy reversal at [facility] in [place], [place]. Age 39-15. -No treatment for Crohn's disease in some years per his history. (no records available currently) -Per GI - they've started solumedrol, checking stool studies and c.diff -GI considering repeat CSP once SBO sx resolved and biologic therapy Severe terminal ileal bowel wall thickening and surrounding inflammatory changes on current CT Weight loss/malnutrition Deconditioning ( pt notes they watch TV at home, but no sports type activity) Tobacco Use Paranoid Schizophrenia - Resides in group home with prior hospitalizations for suicidal ideations. Possible UTI DVT Proph - lovenox and SCD's    LOS: 1 day    Nonie Hoyer 10/23/2014, 8:05 AM Pager: [phone]

## 2014-10-23 NOTE — Progress Notes (Signed)
Subjective: Nausea improved with nasogastric tube. Less abdominal pain. No flatus or bowel movements.  Objective: Vital signs in last 24 hours: Temp:  [98.1 F (36.7 C)-98.5 F (36.9 C)] 98.5 F (36.9 C) (08/30 0642) Pulse Rate:  [63-81] 65 (08/30 0642) Resp:  [16] 16 (08/30 0642) BP: (115-129)/(70-79) 118/77 mmHg (08/30 0642) SpO2:  [97 %-99 %] 99 % (08/30 0642) Weight:  [52.164 kg (115 lb)] 52.164 kg (115 lb) (08/30 1057) Weight change:    PE: GEN:  Thin and cachectic- and malnourished-appearing ABD:  Thin, soft but significant tympany upon percussion; hyperactive high pitched bowel sounds  Lab Results: CBC    Component Value Date/Time   WBC 9.3 10/22/2014 0548   RBC 5.70 10/22/2014 0548   HGB 15.5 10/22/2014 0548   HCT 44.6 10/22/2014 0548   PLT 322 10/22/2014 0548   MCV 78.2 10/22/2014 0548   MCH 27.2 10/22/2014 0548   MCHC 34.8 10/22/2014 0548   RDW 15.1 10/22/2014 0548   CMP     Component Value Date/Time   NA 136 10/23/2014 0318   K 3.6 10/23/2014 0318   CL 98* 10/23/2014 0318   CO2 28 10/23/2014 0318   GLUCOSE 141* 10/23/2014 0318   BUN 17 10/23/2014 0318   CREATININE 0.86 10/23/2014 0318   CALCIUM 8.9 10/23/2014 0318   PROT 8.7* 10/22/2014 0548   ALBUMIN 3.8 10/22/2014 0548   AST 26 10/22/2014 0548   ALT 37 10/22/2014 0548   ALKPHOS 95 10/22/2014 0548   BILITOT 0.6 10/22/2014 0548   GFRNONAA >60 10/23/2014 0318   GFRAA >60 10/23/2014 0318   Assessment:  1. Small bowel obstruction. Likely from active Crohn's disease. 2. Nausea and vomiting, likely from #1 above. 3. Abnormal CT scan abdomen/pelvis, likely reflective of acute Crohn's flare complicated by small bowel obstruction. 4. Weight loss, abnormal, likely from decreased peroral intake over the past couple months in turn likely due to smoldering and progressively worsening Crohn's disease.  Plan:  1.  Continue NPO and NGT. 2.  Continue intravenous steroids. 3.  Appreciate surgical  assistance; if no improvement after a few days of medical management, would need to consider surgical intervention. 4.  [facility] will follow.   Freddy Jaksch 10/23/2014, 12:31 PM   Pager [phone] If no answer or after 5 PM call [phone]

## 2014-10-23 NOTE — Progress Notes (Signed)
Family Medicine Teaching Service Daily Progress Note Intern Pager: [phone]  Patient name: Larry Leonard Medical record number: [id number] Date of birth: September 05, 1975 Age: 39 y.o. Gender: male  Primary Care Provider: No PCP Per Patient Consultants: Surgery, GI Code Status: FULL  Pt Overview and Major Events to Date:  8/29: Admitted with SBO. NPO, NGT placed and started on Solumedrol BID.  Assessment and Plan:  Larry Leonard is a 39 y.o. male admitted with two months of progressive abdominal pain, nausea and vomiting and found to have small bowel obstruction. PMH is significant for Chron's disease and paranoid schizophrenia.  Small Bowel Obstruction: Most likely etiology of SBO includes Chron's disease complication from terminal ileum inflammation;  however, adhesions from prior abdominal surgery are another consideration. Clinically, patient has significantly improved overnight, with complete resolution of pain and nausea. Serum electrolytes in acceptable range this morning. NGT with 900 mL output of clear fluid overnight and abdominal XR today shows persistence of SBO. - Appreciate GI and Surgery recommendations; will continue to follow. - Considering significant NGT output overnight, will continue NGT with intermittent suction today and reassess in 24 hours. - NPO - IVF: D51/2 NS @ 100 mL/hr - Solumedrol per GI - Phenergan PRN - Daily BMP and CBC - Replete electrolytes as appropriate - F/U stool studies  Crohn's Disease Exacerbation: Patient with progression of diffuse abdominal pain over the past two months, worsening over the past two weeks. Also with poor PO intake and significant weight loss. He is not currently followed on an outpatient basis for his Crohn's disease and has not been on disease-modifying agents for several years. - Folate, Vitamin B-12 WNL - Will work to ensure that patient is followed on an outpatient basis following discharge. - Per GI, consider repeat colonoscopy  pending improvement of SBO symptoms. May also ultimately benefit from biologic therapy such as Remicade or Humira, or from immunomodulator therapy.  Severe Protein-Caloric Malnutrition: 30 pound weight loss over the past two months with noted cachexia and muscle wasting on exam. Serum albumin 3.8 g/dL on admission. Likely secondary to subacute flare/progressive Crohn's disease and should improve with treatment of disease. - Vitamin D pending - HIV antibody pending - nutrition consult  Schizophrenia, Paranoid-Type: Stable at present. Currently administering oral antipsychotics with interruptions in NGT. Will consider IM Zyprexa if patient unable to tolerate this method. - Continue home olanzapine 10 mg ghs and quetiapine 50 mg qhs - Continue to monitor  Tobacco Use: Current 1 PPD smoker; since age [age]. - Encouraged cessation  FEN/GI: NPO PPx: Lovenox  Disposition: Continue inpatient admission pending improvement in clinical status.  Subjective: Overnight, patient reports significant improvement in abdominal pain (0/0 this morning) without requirement of pain medication. Unfortunately, his NGT came out overnight; however, it was replaced without issue, and he also reports improvement in nausea. Not yet had a BM or passing flatus. Tolerated clamping the NG tube in order to take his antipsychotic medications well.  Objective: Temp:  [98.1 F (36.7 C)-98.5 F (36.9 C)] 98.5 F (36.9 C) (08/30 0642) Pulse Rate:  [63-116] 65 (08/30 0642) Resp:  [16-18] 16 (08/30 0642) BP: (106-129)/(66-85) 118/77 mmHg (08/30 0642) SpO2:  [95 %-100 %] 99 % (08/30 8657)  Physical Exam: General: Resting comfortably in bed. NAD. HEENT: Moist mucous membranes. NGT in place. Cardiovascular: RRR without murmur/rub/gallop. Radial pulses 2+ bilaterally. Respiratory: LCTAB without crackle/wheeze. Normal work of breathing on room air. Abdomen: Soft and non-distended. Bowel sounds decreased throughout, but  present. Mild tenderness  to palpation over the umbilicus, LLQ, and RLQ. Extremities: Thin with decreased muscle bulk. Warm and well-perfused without edema or erythema.  Intake/Output Summary (Last 24 hours) at 10/23/14 0824 Last data filed at 10/23/14 0500  Gross per 24 hour  Intake 1253.33 ml  Output    900 ml  Net 353.33 ml    Laboratory:  Recent Labs Lab 10/22/14 0548  WBC 9.3  HGB 15.5  HCT 44.6  PLT 322    Recent Labs Lab 10/22/14 0548 10/23/14 0318  NA 136 136  K 4.2 3.6  CL 98* 98*  CO2 26 28  BUN 6 17  CREATININE 0.94 0.86  CALCIUM 9.9 8.9  PROT 8.7*  --   BILITOT 0.6  --   ALKPHOS 95  --   ALT 37  --   AST 26  --   GLUCOSE 179* 141*    Folate: 12.8 (WNL) Vitamin B12: 324 (WNL) Vitamin D: Pending HIV Antibody: Pending  Urine Culture: Pending  Imaging/Diagnostic Studies:  ABDOMEN - 2 VIEW  COMPARISON: Abdominal CT and radiograph from yesterday  FINDINGS: New nasogastric tube with tip in the distal stomach. Fluid levels and paucity of lower abdominal gas which correlate with dilated small bowel on previous abdominal CT. No indication of pneumatosis or perforation. Normal unintentional cystogram.  IMPRESSION: 1. Unchanged bowel gas pattern, partial small bowel obstruction likely persists. 2. Nasogastric tube remains in good position.  Melford Aase, Med Student 10/23/2014, 7:27 AM MS4, [facility] [employer] [employer] [facility] [employer] FPTS Intern pager: [phone], text pages welcome  RESIDENT ADDENDUM I have separately seen and examined the patient. I have discussed the findings and exam with the medical student and agree with the above note. Additionally I have outlined my exam and assessment/plan below:  PE: General: NAD, laying on side HEENT: NGT in place CV: RRR, nl s1s2 no mrg. 2+ radial, PT pulses bilaterally Resp: CTAB, nl effort Abd: soft, thin, mildly tender but not distended, minimal bowel sounds Extremities: thin, no LE edema Neuro: alert and  oriented x 3, no focal deficits Skin: no rashes  A/P: Larry Leonard is a 39 y.o. male admitted for abdominal pain and SBO. PMH significant for Crohn's disease and tobacco abuse.  # Crohn's Disease with Small Bowel Obstruction: abdominal pain requiring dilaudid in ED. CT abd with evidence of crohn's and SBO. NGT placed in ED. GI and general surgery were consulted in ED. - NGT with intermittent suction, no clamp trial or diet today. - solumedrol per GI - phenergan PRN - appreciate GI and gen surgery recs  # Paranoid Schizophrenia: stable currently - continue seroquel and zyprexa qHS  # Severe protein calorie malnutrition - nutrition consult - add supplements when diet is advanced  # FEN/GI: - D5 1/2NS at 100cc/hr while NPO - lovenox  Tawni Carnes, MD 10/23/2014, 12:47 PM PGY-3, [facility] FPTS Intern Pager: [phone], text pages welcome

## 2014-10-24 ENCOUNTER — Inpatient Hospital Stay (HOSPITAL_COMMUNITY): Payer: Medicare Other

## 2014-10-24 ENCOUNTER — Encounter (HOSPITAL_COMMUNITY): Payer: Self-pay | Admitting: General Practice

## 2014-10-24 LAB — CBC
HEMATOCRIT: 41.9 % (ref 39.0–52.0)
Hemoglobin: 14.3 g/dL (ref 13.0–17.0)
MCH: 27.3 pg (ref 26.0–34.0)
MCHC: 34.1 g/dL (ref 30.0–36.0)
MCV: 80 fL (ref 78.0–100.0)
Platelets: 314 10*3/uL (ref 150–400)
RBC: 5.24 MIL/uL (ref 4.22–5.81)
RDW: 14.9 % (ref 11.5–15.5)
WBC: 5 10*3/uL (ref 4.0–10.5)

## 2014-10-24 LAB — BASIC METABOLIC PANEL
Anion gap: 8 (ref 5–15)
BUN: 9 mg/dL (ref 6–20)
CHLORIDE: 101 mmol/L (ref 101–111)
CO2: 28 mmol/L (ref 22–32)
Calcium: 9 mg/dL (ref 8.9–10.3)
Creatinine, Ser: 0.85 mg/dL (ref 0.61–1.24)
GFR calc Af Amer: >60 mL/min (ref >60)
GFR calc non Af Amer: >60 mL/min (ref >60)
GLUCOSE: 128 mg/dL — AB (ref 65–99)
POTASSIUM: 4.2 mmol/L (ref 3.5–5.1)
Sodium: 137 mmol/L (ref 135–145)

## 2014-10-24 LAB — VITAMIN D 25 HYDROXY (VIT D DEFICIENCY, FRACTURES): Vit D, 25-Hydroxy: 19.3 ng/mL — ABNORMAL LOW (ref 30.0–100.0)

## 2014-10-24 NOTE — Progress Notes (Signed)
Subjective: Patient feels his stomach is more active. However, no flatus or bowel movements yet. NGT was removed.  Objective: Vital signs in last 24 hours: Temp:  [98.5 F (36.9 C)-99 F (37.2 C)] 98.8 F (37.1 C) (08/31 0548) Pulse Rate:  [50-60] 60 (08/31 0548) Resp:  [14-16] 14 (08/31 0548) BP: (121-135)/(75-76) 123/76 mmHg (08/31 0548) SpO2:  [98 %-99 %] 98 % (08/31 0548) Weight change:  Last BM Date: 10/22/14  PE: GEN:  Thin, somewhat cachectic-appearing, NAD HEENT:  NGT removed ABD:  Improving distention and tympany, soft, non-tender, scattered high-pitched bowel sounds  Lab Results: CBC    Component Value Date/Time   WBC 5.0 10/24/2014 0958   RBC 5.24 10/24/2014 0958   HGB 14.3 10/24/2014 0958   HCT 41.9 10/24/2014 0958   PLT 314 10/24/2014 0958   MCV 80.0 10/24/2014 0958   MCH 27.3 10/24/2014 0958   MCHC 34.1 10/24/2014 0958   RDW 14.9 10/24/2014 0958   CMP     Component Value Date/Time   NA 137 10/24/2014 0958   K 4.2 10/24/2014 0958   CL 101 10/24/2014 0958   CO2 28 10/24/2014 0958   GLUCOSE 128* 10/24/2014 0958   BUN 9 10/24/2014 0958   CREATININE 0.85 10/24/2014 0958   CALCIUM 9.0 10/24/2014 0958   PROT 8.7* 10/22/2014 0548   ALBUMIN 3.8 10/22/2014 0548   AST 26 10/22/2014 0548   ALT 37 10/22/2014 0548   ALKPHOS 95 10/22/2014 0548   BILITOT 0.6 10/22/2014 0548   GFRNONAA >60 10/24/2014 0958   GFRAA >60 10/24/2014 0958   Assessment:  1. Small bowel obstruction. Likely from active Crohn's disease.  No flatus or bowel movement yet, but patient reports some increased bowel activity 2. Nausea and vomiting, likely from #1 above. 3. Abnormal CT scan abdomen/pelvis, likely reflective of acute Crohn's flare complicated by small bowel obstruction. 4. Weight loss, abnormal, likely from decreased peroral intake over the past couple months in turn likely due to smoldering and progressively worsening Crohn's disease.  Plan:  1.  Sips clears started  by surgical team. 2.  We'll see over the next 24 hours whether NGT needs to be replaced. 3.  Continue intravenous steroids. 4.  Will d/c enteric precautions since he's not having any bowel movements. 5.  [facility] will follow.    Larry Leonard 10/24/2014, 12:17 PM   Pager [phone] If no answer or after 5 PM call [phone]

## 2014-10-24 NOTE — Progress Notes (Signed)
[facility] Progress Note     Subjective: Pt doing well.  Says his bowels are making more noise.  Denies flatus yet, no BM either.  No pain or N/V.  Much less distended.  Up OOB some.  Wants NG out.  Thirsty/hungry.  Objective: Vital signs in last 24 hours: Temp:  [98.5 F (36.9 C)-99 F (37.2 C)] 98.8 F (37.1 C) (08/31 0548) Pulse Rate:  [50-60] 60 (08/31 0548) Resp:  [14-16] 14 (08/31 0548) BP: (121-135)/(75-76) 123/76 mmHg (08/31 0548) SpO2:  [98 %-99 %] 98 % (08/31 0548) Weight:  [52.164 kg (115 lb)] 52.164 kg (115 lb) (08/30 1057)    Intake/Output from previous day: 08/30 0701 - 08/31 0700 In: 2596.7 [I.V.:2596.7] Out: 2000 [Urine:500; Emesis/NG output:1500] Intake/Output this shift:    PE: Gen:  Alert, NAD, pleasant Abd: Soft, minimal distension, not tender, +BS, no HSM, midline and old ostomy site abdominal scars noted   Lab Results:   Recent Labs  10/22/14 0548  WBC 9.3  HGB 15.5  HCT 44.6  PLT 322   BMET  Recent Labs  10/22/14 0548 10/23/14 0318  NA 136 136  K 4.2 3.6  CL 98* 98*  CO2 26 28  GLUCOSE 179* 141*  BUN 6 17  CREATININE 0.94 0.86  CALCIUM 9.9 8.9   PT/INR No results for input(s): LABPROT, INR in the last 72 hours. CMP     Component Value Date/Time   NA 136 10/23/2014 0318   K 3.6 10/23/2014 0318   CL 98* 10/23/2014 0318   CO2 28 10/23/2014 0318   GLUCOSE 141* 10/23/2014 0318   BUN 17 10/23/2014 0318   CREATININE 0.86 10/23/2014 0318   CALCIUM 8.9 10/23/2014 0318   PROT 8.7* 10/22/2014 0548   ALBUMIN 3.8 10/22/2014 0548   AST 26 10/22/2014 0548   ALT 37 10/22/2014 0548   ALKPHOS 95 10/22/2014 0548   BILITOT 0.6 10/22/2014 0548   GFRNONAA >60 10/23/2014 0318   GFRAA >60 10/23/2014 0318   Lipase     Component Value Date/Time   LIPASE 16* 10/22/2014 0548       Studies/Results: Ct Abdomen Pelvis W Contrast  10/22/2014   CLINICAL DATA:  Umbilical pain for 2 months with nausea, vomiting, diarrhea for  2 days. History of Crohn's disease.  EXAM: CT ABDOMEN AND PELVIS WITH CONTRAST  TECHNIQUE: Multidetector CT imaging of the abdomen and pelvis was performed using the standard protocol following bolus administration of intravenous contrast.  CONTRAST:  OMNIPAQUE IOHEXOL 300 MG/ML  SOLN  COMPARISON:  None.  FINDINGS: Lower chest:  Clear lung bases.  Normal heart size.  Hepatobiliary: Normal liver. No hepatic lesion. Distended gallbladder which may be secondary to prolonged fasting. Normal caliber common bile duct.  Pancreas: Normal.  Spleen: Normal.  Adrenals/Urinary Tract: Normal adrenal glands. Normal kidneys. No obstructive uropathy. Normal bladder.  Stomach/Bowel: Fluid-filled distended stomach. Small bowel dilatation with numerous fluid-filled loops of small bowel measuring up to 3.9 cm. Severe terminal ileal bowel wall thickening and surrounding inflammatory changes. Prior cecal surgery with surgical suture line present. No abdominal or pelvic free fluid. No pneumatosis, pneumoperitoneum or portal venous gas.  Vascular/Lymphatic: Normal caliber abdominal aorta. No abdominal or pelvic lymphadenopathy.  Other: No fluid collection or hematoma.  Musculoskeletal: No acute osseous abnormality. No lytic or sclerotic osseous lesion.  IMPRESSION: 1. Severe terminal ileal bowel wall thickening and surrounding inflammatory changes most consistent with terminal ileitis as can be seen with Crohn's disease given the patient's  history. This is resulting in small bowel obstruction with significant small bowel dilatation and gastric distension.   Electronically Signed   By: Elige Ko   On: 10/22/2014 10:16   Dg Chest Portable 1 View  10/22/2014   CLINICAL DATA:  Nasogastric tube insertion.  EXAM: PORTABLE CHEST - 1 VIEW  COMPARISON:  10/17/2008  FINDINGS: There has been placement of enteric catheter coiled on itself over the expected location of gastric body. A second radiopaque catheter is seen transversing the  midline of thorax and upper abdomen, with tip collimated off the image.  Cardiomediastinal silhouette is normal. Mediastinal contours appear intact.  There is no evidence of focal airspace consolidation, pleural effusion or pneumothorax.  Osseous structures are without acute abnormality. Residual contrast is seen within the collecting system of both kidneys. There is gaseous distention of the visualized small bowel loops. Soft tissues are grossly normal.  IMPRESSION: Enteric catheter with distal tip seen overlying the expected location of gastric body.  Second catheter transversing the thorax with tip collimated off the image.  Gaseous distention of small bowel loops seen in the upper abdomen, similar to previously seen small bowel obstruction, demonstrated by CT from the same date.   Electronically Signed   By: Ted Mcalpine M.D.   On: 10/22/2014 11:48   Dg Abd 2 Views  10/23/2014   CLINICAL DATA:  Small bowel obstruction and nasogastric tube placement.  EXAM: ABDOMEN - 2 VIEW  COMPARISON:  Abdominal CT and radiograph from yesterday  FINDINGS: New nasogastric tube with tip in the distal stomach. Fluid levels and paucity of lower abdominal gas which correlate with dilated small bowel on previous abdominal CT. No indication of pneumatosis or perforation. Normal unintentional cystogram.  IMPRESSION: 1. Unchanged bowel gas pattern, partial small bowel obstruction likely persists. 2. Nasogastric tube remains in good position.   Electronically Signed   By: Marnee Spring M.D.   On: 10/23/2014 07:53    Anti-infectives: Anti-infectives    Start     Dose/Rate Route Frequency Ordered Stop   10/22/14 1045  cefTRIAXone (ROCEPHIN) 1 g in dextrose 5 % 50 mL IVPB     1 g 100 mL/hr over 30 Minutes Intravenous  Once 10/22/14 1034 10/22/14 1125       Assessment/Plan SBO -Medical management including IVF, pain control, antiemetics -No more air fluid levels on xray, clinically improved.  Great BS.  D/c NT  tube and start sips of clears -Would avoid surgery in this acute flair setting -Ambulate and IS Crohn's disease  -Probable partial colectomy, colostomy, open appendectomy and colostomy reversal at [facility] in [place], [place]. Age 61-15. -No treatment for Crohn's disease in some years per his history. (no records available currently) -Per GI - they've started solumedrol, checking stool studies and c.diff -GI considering repeat CSP once SBO sx resolved and biologic therapy Severe terminal ileal bowel wall thickening and surrounding inflammatory changes on current CT Weight loss/malnutrition Deconditioning ( pt notes they watch TV at home, but no sports type activity) Tobacco Use Paranoid Schizophrenia - Resides in group home with prior hospitalizations for suicidal ideations. Possible UTI DVT Proph - lovenox and SCD's    LOS: 2 days    Nonie Hoyer 10/24/2014, 7:34 AM Pager: [phone]

## 2014-10-24 NOTE — Progress Notes (Signed)
Family Medicine Teaching Service Daily Progress Note Intern Pager: [phone]  Patient name: CHARISTOPHER RUMBLE Medical record number: [id number] Date of birth: March 24, 1975 Age: 39 y.o. Gender: male  Primary Care Provider: No PCP Per Patient Consultants: Surgery, Gastroenterology Code Status: FULL  Pt Overview and Major Events to Date:  8/29: Admitted with SBO. NPO, NGT placed and started on Solumedrol BID. 8/30: Continued NPO, NGT with improvement in clinical status.  Assessment and Plan:  VICTORIOUS KUNDINGER is a 39 y.o. male admitted with two months of progressive abdominal pain, nausea and vomiting and found to have small bowel obstruction. PMH is significant for Chron's disease and paranoid schizophrenia.  Small Bowel Obstruction: Most likely etiology of SBO includes Chron's disease complication from terminal ileum inflammation; however, adhesions from prior abdominal surgery are another consideration. Patient with continued symptomatic improvement and normoactive bowel sounds on exam, though not yet passing flatus or BM. NGT with 1500 mL output of clear fluid overnight.  - Appreciate GI and Surgery recommendations; will continue to follow. - Although patient with 1.5 L output via NGT overnight, output is clear and patient with significant clinical improvement. Per surgery recommendations, will discontinue NGT today and slowly progress clear liquid diet as tolerated. - IVF: D51/2 NS @ 100 mL/hr - Solumedrol per GI - Phenergan PRN - Daily BMP and CBC - Replete electrolytes as appropriate - F/U stool studies - IS, encourage ambulation  Crohn's Disease: Patient with progression of diffuse abdominal pain over the past two months, worsening over the past two weeks. Also with poor PO intake and significant weight loss. He is not currently followed on an outpatient basis for his Crohn's disease and has not been on disease-modifying agents for several years. - Will work to ensure that patient is followed  on an outpatient basis following discharge. - Per GI, consider repeat colonoscopy pending improvement of SBO symptoms. May also ultimately benefit from biologic therapy such as Remicade or Humira, or from immunomodulator therapy.  Severe Protein-Caloric Malnutrition: 35 pound weight loss over the past two months with noted cachexia and muscle wasting on exam. Serum albumin 3.8 g/dL on admission. Likely secondary to subacute flare/progressive Crohn's disease and should improve with treatment of disease. Nutrition consulted, following. - Vitamin D low, will recommend supplementation at discharge - HIV antibody negative  Schizophrenia, Paranoid-Type: Stable at present. Currently administering oral antipsychotics with interruptions in NGT. Will consider IM Zyprexa if patient unable to tolerate this method. Most recent aripiprazole injection within the past week; this morning, patient reports receiving this q2 weeks. - Continue home olanzapine 10 mg ghs and quetiapine 50 mg qhs - Continue to monitor  Possible UTI: On admission, UA with trace leukocytes and SG 1.034. Received one dose Rocephin in ED. Urine culture negative; likely dehydration rather than UTI.  FEN/GI: NPO PPx: Lovenox  Disposition: Continue inpatient admission pending improvement in clinical status.  Subjective:  Patient with complete resolution of abdominal pain and nausea. Also reports that bowels seem to be waking up and making more noise. Denies passing flatus or BM. Anxious for NGT removal and to attempt a liquid diet.  Objective: Temp:  [98.5 F (36.9 C)-99 F (37.2 C)] 98.8 F (37.1 C) (08/31 0548) Pulse Rate:  [50-60] 60 (08/31 0548) Resp:  [14-16] 14 (08/31 0548) BP: (121-135)/(75-76) 123/76 mmHg (08/31 0548) SpO2:  [98 %-99 %] 98 % (08/31 0548) Weight:  [52.164 kg (115 lb)] 52.164 kg (115 lb) (08/30 1057)  Physical Exam: General: Thin, malnourished man resting comfortably  in bed. HEENT: NGT in place with clear,  green-tinted output. Cardiovascular: RRR without murmur/rub/gallop. Respiratory: LCTAB without crackle/wheeze. Abdomen: Soft, non-tender and non-distended. Normoactive bowel sounds throughout. No palpable abdominal masses or hepatosplenomegaly. Extremities: Thin, warm and well-perfused. SCDs in place.   Intake/Output Summary (Last 24 hours) at 10/24/14 0825 Last data filed at 10/24/14 1610  Gross per 24 hour  Intake 2596.67 ml  Output   2000 ml  Net 596.67 ml    Laboratory:  Recent Labs Lab 10/22/14 0548  WBC 9.3  HGB 15.5  HCT 44.6  PLT 322    Recent Labs Lab 10/22/14 0548 10/23/14 0318  NA 136 136  K 4.2 3.6  CL 98* 98*  CO2 26 28  BUN 6 17  CREATININE 0.94 0.86  CALCIUM 9.9 8.9  PROT 8.7*  --   BILITOT 0.6  --   ALKPHOS 95  --   ALT 37  --   AST 26  --   GLUCOSE 179* 141*    HIV: Non-reactive 25-OH Vitamin D: 19.3 (L)  Imaging/Diagnostic Tests: None  Melford Aase, Med Student 10/24/2014, 7:19 AM MS4, [employer] FPTS Intern pager: [phone], text pages welcome   RESIDENT ADDENDUM  I have separately seen and examined the patient. I have discussed the findings and exam with the medical student and agree with the above note, which I have edited appropriately. I helped develop the management plan that is described in the student's note, and I agree with the content.  Additionally I have outlined my exam and assessment/plan below:   PE: General: NAD, laying in bed HEENT: NCAT, MMM, NGT no longer in place CV: RRR, no mrg. Intact distal pulses Resp: CTAB, nl WOB Abd: soft, thin, NTND, + bowel sounds Extremities: thin, no LE edema Neuro: alert and oriented x 3, no focal deficits Skin: no rashes  A/P:  VERDUN RACKLEY is a 39 y.o. male admitted for abdominal pain and SBO. PMH significant for Crohn's disease and tobacco abuse.  # Crohn's Disease with Small Bowel Obstruction: abdominal pain improving and return of bowel sounds. NGT removed by  surgery this AM. CT abd with evidence of crohn's and SBO. - GI and general surgery consulted, appreciate recs. - solumedrol per GI - phenergan PRN - will advance diet slowly to Clear liquids  # Paranoid Schizophrenia: stable currently - continue seroquel and zyprexa qHS  # Severe protein calorie malnutrition - nutrition consult - add supplements when diet is advanced  # FEN/GI: - D5 1/2NS at 100cc/hr while NPO - lovenox   Erasmo Downer, MD PGY-2,  [facility] 10/24/2014  9:21 AM

## 2014-10-25 ENCOUNTER — Inpatient Hospital Stay (HOSPITAL_COMMUNITY): Payer: Medicare Other

## 2014-10-25 MED ORDER — CALCIUM CARBONATE ANTACID 500 MG PO CHEW
1.0000 | CHEWABLE_TABLET | Freq: Every day | ORAL | Status: DC
  Administered 2014-10-25 – 2014-10-27 (×3): 200 mg via ORAL
  Filled 2014-10-25 (×3): qty 1

## 2014-10-25 MED ORDER — BOOST / RESOURCE BREEZE PO LIQD
1.0000 | Freq: Three times a day (TID) | ORAL | Status: DC
Start: 2014-10-25 — End: 2014-10-26
  Administered 2014-10-25 (×2): 1 via ORAL

## 2014-10-25 NOTE — Care Management Important Message (Signed)
Important Message  Patient Details  Name: Larry Leonard MRN: [id number] Date of Birth: September 29, 1975   Medicare Important Message Given:  Yes-second notification given    Orson Aloe 10/25/2014, 12:21 PM

## 2014-10-25 NOTE — Progress Notes (Signed)
Family Medicine Teaching Service Daily Progress Note Intern Pager: [phone]  Patient name: Larry Leonard Medical record number: [id number] Date of birth: Apr 14, 1975 Age: 39 y.o. Gender: male  Primary Care Provider: No PCP Per Patient Consultants: Surgery, Gastrointestinal Code Status: FULL  Pt Overview and Major Events to Date:  8/29: Admitted with SBO. NPO, NGT placed and started on Solumedrol BID. 8/30: Continued NPO, NGT with improvement in clinical status. 8/31: Discontinued NGT, advanced diet to clears. Passing flatus.  Assessment and Plan:  Larry Leonard is a 39 y.o. male admitted with two months of progressive abdominal pain, nausea and vomiting and found to have small bowel obstruction. PMH is significant for Chron's disease and paranoid schizophrenia.  Small Bowel Obstruction: Most likely etiology of SBO includes Chron's disease complication from terminal ileum inflammation; however, adhesions from prior abdominal surgery are another consideration. Clinical and radiographic improvement overnight. Electrolytes WNL yesterday. - Appreciate GI and Surgery recommendations; will continue to follow. - Will advance diet to liquids during the day and regular diet at night. Instructed patient to take this slowly and stop with pain. - KVO IVF - Solumedrol BID per GI - Phenergan PRN - Daily BMP and CBC - Replete electrolytes as appropriate - F/U stool studies - IS, encouraged ambulation  Crohn's Disease: Patient with progression of diffuse abdominal pain over the past two months, worsening over the past two weeks. Also with poor PO intake and significant weight loss. - Will work to ensure that patient is followed on an outpatient basis following discharge. - Per GI, consider repeat colonoscopy pending improvement of SBO symptoms. May also ultimately benefit from biologic therapy such as Remicade or Humira, or from immunomodulator therapy.  Severe Protein-Caloric Malnutrition: 35 pound  weight loss over the past two months with noted cachexia and muscle wasting on exam. Serum albumin 3.8 g/dL on admission. Likely secondary to subacute flare/progressive Crohn's disease and should improve with treatment of disease. Nutrition consulted, following. - Vitamin D low, will recommend supplementation at discharge  Schizophrenia, Paranoid-Type: Stable. Most recent aripiprazole injection within the past week, receives q 2 weeks. - Continue home olanzapine 10 mg ghs and quetiapine 50 mg qhs - Continue to monitor  FEN/GI: Liquid diet PPx: Lovenox  Disposition: Continue inpatient admission pending return of bowel function, clinical improvement.  Subjective:  Following removal of NGT and advancement of diet to clear liquids, patient reports continued improvement. He does report mild abdominal discomfort after eating, but reports that this quickly subsided. He has passed flatus, but is unsure if he had a bowel movement yesterday, stating that he "cannot remember." We discussed that it was very important for him to note if he has a bowel movement today. Denies nausea and vomiting; endorses increased appetite.  Objective: Temp:  [98.3 F (36.8 C)-98.6 F (37 C)] 98.3 F (36.8 C) (09/01 0545) Pulse Rate:  [55-65] 55 (09/01 0545) Resp:  [16-19] 19 (09/01 0545) BP: (104-125)/(58-81) 104/58 mmHg (09/01 0545) SpO2:  [99 %] 99 % (09/01 0545)  Physical Exam: General: Sitting up comfortably and eating breakfast. No acute distress. Cardiovascular: RRR without murmur/rub/gallop. Respiratory: Normal work of breathing on room air. LCTAB without crackle/wheeze. Abdomen: Soft and non-distended. +BS throughout. Mild tenderness to palpation in the LLQ. Extremities: Warm and well-perfused without edema or erythema. No calf tenderness present.  Laboratory:  Recent Labs Lab 10/22/14 0548 10/24/14 0958  WBC 9.3 5.0  HGB 15.5 14.3  HCT 44.6 41.9  PLT 322 314    Recent Labs Lab  10/22/14 0548  10/23/14 0318 10/24/14 0958  NA 136 136 137  K 4.2 3.6 4.2  CL 98* 98* 101  CO2 BUN CREATININE 0.94 0.86 0.85  CALCIUM 9.9 8.9 9.0  PROT 8.7*  --   --   BILITOT 0.6  --   --   ALKPHOS 95  --   --   ALT 37  --   --   AST 26  --   --   GLUCOSE 179* 141* 128*   Dg Abd Portable 1v  10/25/2014   CLINICAL DATA:  Small bowel obstruction, followup  EXAM: PORTABLE ABDOMEN - 1 VIEW  COMPARISON:  CT abdomen pelvis of 10/22/2014 and portable abdomen of 10/24/2014  FINDINGS: There has been a decrease in the degree of small bowel gaseous distention with some colonic bowel gas present. These findings indicate some improvement in the partial small bowel obstructive pattern. No opaque calculi are noted.  IMPRESSION: Some improvement in partial small bowel obstruction.   Electronically Signed   By: Dwyane Dee M.D.   On: 10/25/2014 08:14    Melford Aase, Med Student 10/25/2014, 7:22 AM MS4, [facility] FPTS Intern pager: [phone], text pages welcome   RESIDENT ADDENDUM I have separately seen and evaluated the patient. I have discussed the findings and exam with the medical student and agree with the above not. Below are my specific findings:  S: Tolerated clear fluids yesterday evening, passing flatus. DENIES BM to me.   O: Afebrile, VSS  A/P: 39 yo male with SBO related to flare of crohn's. PMH: Crohn's and smoking. Crohn's disease with acute ileitis and SBO: NGT pulled 8/31 and pain, nausea, vomiting is somewhat improving slowly though still without BM.  - Advancing diet slowly to full liquids today.  - Await bowel movement: Abd XR in AM if no BM.  - Continue solumedrol BID per GI, pain control, antiemetic prn.  - Will be followed by [facility] as outpatient.  Paranoid schizophrenia: Stable on zyprexa, seroquel Sever protein calorie malnutrition: Nutrition   Tearsa Kowalewski B. Jarvis Newcomer, MD, PGY-3 10/25/2014 1:58 PM

## 2014-10-25 NOTE — Clinical Social Work Note (Signed)
CSW contacted [facility] assisted [facility] to confirm patient can return once he is medically ready and discharge orders have been received.  [facility] informed CSW that if patient is ready tomorrow he can return back to facility.  FL2 on chart waiting for signature by physician.  Ervin Knack. Kanon Novosel, MSW, Theresia Majors [phone] 10/25/2014 2:10 PM

## 2014-10-25 NOTE — Progress Notes (Signed)
Subjective: Tolerating some clear liquids. Reports bowel movement x 1 yesterday. Passing some flatus.  Objective: Vital signs in last 24 hours: Temp:  [98.3 F (36.8 C)-98.6 F (37 C)] 98.3 F (36.8 C) (09/01 0545) Pulse Rate:  [55-65] 55 (09/01 0545) Resp:  [16-19] 19 (09/01 0545) BP: (104-125)/(58-81) 104/58 mmHg (09/01 0545) SpO2:  [99 %] 99 % (09/01 0545) Weight change:  Last BM Date: 10/22/14  PE: GEN:  Thin and cachectic-appearing ABD:  Less distended, less tympanic, non-tender, bowel sounds less hyperactive  Lab Results: CBC    Component Value Date/Time   WBC 5.0 10/24/2014 0958   RBC 5.24 10/24/2014 0958   HGB 14.3 10/24/2014 0958   HCT 41.9 10/24/2014 0958   PLT 314 10/24/2014 0958   MCV 80.0 10/24/2014 0958   MCH 27.3 10/24/2014 0958   MCHC 34.1 10/24/2014 0958   RDW 14.9 10/24/2014 0958   CMP     Component Value Date/Time   NA 137 10/24/2014 0958   K 4.2 10/24/2014 0958   CL 101 10/24/2014 0958   CO2 28 10/24/2014 0958   GLUCOSE 128* 10/24/2014 0958   BUN 9 10/24/2014 0958   CREATININE 0.85 10/24/2014 0958   CALCIUM 9.0 10/24/2014 0958   PROT 8.7* 10/22/2014 0548   ALBUMIN 3.8 10/22/2014 0548   AST 26 10/22/2014 0548   ALT 37 10/22/2014 0548   ALKPHOS 95 10/22/2014 0548   BILITOT 0.6 10/22/2014 0548   GFRNONAA >60 10/24/2014 0958   GFRAA >60 10/24/2014 0958   Assessment:  1. Small bowel obstruction. Likely from active Crohn's disease. Slowly improving. 2. Nausea and vomiting, likely from #1 above, resolved, tolerating clear liquids without NGT and without nausea/vomiting. 3. Abnormal CT scan abdomen/pelvis, likely reflective of acute Crohn's flare complicated by small bowel obstruction. 4. Weight loss, abnormal, likely from decreased peroral intake over the past couple months in turn likely due to smoldering and progressively worsening Crohn's disease.  Plan:  1.  Advance diet per surgical team. 2.  IV steroids for one more day,  consider transition to oral prednisone tomorrow. 3.  Mobilize, OOBTC, ambulate halls. 4.  [facility] will follow. Ultimately needs repeat colonoscopy, but I doubt patient would be able to tolerate a bowel prep very well at least for the next couple more days.   Freddy Jaksch 10/25/2014, 11:59 AM   Pager [phone] If no answer or after 5 PM call [phone]

## 2014-10-25 NOTE — Progress Notes (Signed)
[facility] Progress Note     Subjective: Pt says he feels good.  No N/V or abdominal pain.  Doesn't think he feels distended.  He did pass flatus, questionable BM yesterday, but none recorded.  Tolerating clears well last 2 meals.    Objective: Vital signs in last 24 hours: Temp:  [98.3 F (36.8 C)-98.6 F (37 C)] 98.3 F (36.8 C) (09/01 0545) Pulse Rate:  [55-65] 55 (09/01 0545) Resp:  [16-19] 19 (09/01 0545) BP: (104-125)/(58-81) 104/58 mmHg (09/01 0545) SpO2:  [99 %] 99 % (09/01 0545) Last BM Date: 10/22/14  Intake/Output from previous day: 08/31 0701 - 09/01 0700 In: 1083.3 [P.O.:280; I.V.:803.3] Out: 900 [Urine:900] Intake/Output this shift:    PE: Gen:  Alert, NAD, pleasant Abd: Soft, minimal distension, not tender, +BS, no HSM, midline and old ostomy site abdominal scars noted   Lab Results:   Recent Labs  10/24/14 0958  WBC 5.0  HGB 14.3  HCT 41.9  PLT 314   BMET  Recent Labs  10/23/14 0318 10/24/14 0958  NA 136 137  K 3.6 4.2  CL 98* 101  CO2 28 28  GLUCOSE 141* 128*  BUN 17 9  CREATININE 0.86 0.85  CALCIUM 8.9 9.0   PT/INR No results for input(s): LABPROT, INR in the last 72 hours. CMP     Component Value Date/Time   NA 137 10/24/2014 0958   K 4.2 10/24/2014 0958   CL 101 10/24/2014 0958   CO2 28 10/24/2014 0958   GLUCOSE 128* 10/24/2014 0958   BUN 9 10/24/2014 0958   CREATININE 0.85 10/24/2014 0958   CALCIUM 9.0 10/24/2014 0958   PROT 8.7* 10/22/2014 0548   ALBUMIN 3.8 10/22/2014 0548   AST 26 10/22/2014 0548   ALT 37 10/22/2014 0548   ALKPHOS 95 10/22/2014 0548   BILITOT 0.6 10/22/2014 0548   GFRNONAA >60 10/24/2014 0958   GFRAA >60 10/24/2014 0958   Lipase     Component Value Date/Time   LIPASE 16* 10/22/2014 0548       Studies/Results: Dg Abd Portable 1v  10/24/2014   CLINICAL DATA:  Small bowel obstruction, history Crohn's disease  EXAM: PORTABLE ABDOMEN - 1 VIEW  COMPARISON:  Portable exam 0758  hours compared 10/23/2014  FINDINGS: Excreted contrast material within mildly distended urinary bladder.  Air-filled loops of bowel in the mid abdomen.  No definite bowel wall thickening.  Osseous structures unremarkable.  Tip of nasogastric tube projects over distal gastric antrum.  IMPRESSION: Persistent dilatation of air-filled bowel loops in the mid abdomen.   Electronically Signed   By: Ulyses Southward M.D.   On: 10/24/2014 08:04    Anti-infectives: Anti-infectives    Start     Dose/Rate Route Frequency Ordered Stop   10/22/14 1045  cefTRIAXone (ROCEPHIN) 1 g in dextrose 5 % 50 mL IVPB     1 g 100 mL/hr over 30 Minutes Intravenous  Once 10/22/14 1034 10/22/14 1125       Assessment/Plan SBO -Medical management including IVF, pain control, antiemetics -Xrays continue to improve, clinically improved. Great BS and flatus.  -Was started on clears last night and doing well.  Will advance to fulls at dinner if he continues to tolerate unless GI wants to prep him for colonoscopy -Would avoid surgery in this acute flair setting -Ambulate and IS Crohn's disease  -Probable partial colectomy, colostomy, open appendectomy and colostomy reversal at [facility] in [place], [place]. Age 69-15. -No treatment for Crohn's disease in some  years per his history. (no records available currently) -Per GI - they've started solumedrol, pending stool studies and c.diff -GI considering repeat CSP once SBO sx resolved and biologic therapy Severe terminal ileal bowel wall thickening and surrounding inflammatory changes on current CT Weight loss/malnutrition Deconditioning (pt notes they watch TV at home, but no sports type activity) Tobacco Use Paranoid Schizophrenia - Resides in group home with prior hospitalizations for suicidal ideations. Possible UTI DVT Proph - lovenox and SCD's    LOS: 3 days    Nonie Hoyer 10/25/2014, 8:14 AM Pager: [phone]

## 2014-10-25 NOTE — Progress Notes (Signed)
Nutrition Follow-up  DOCUMENTATION CODES:   Severe malnutrition in context of acute illness/injury  INTERVENTION:   Boost Breeze po TID, each supplement provides 250 kcal and 9 grams of protein  NUTRITION DIAGNOSIS:   Malnutrition related to acute illness as evidenced by moderate depletion of body fat, moderate depletions of muscle mass, percent weight loss.  Ongoing  GOAL:   Patient will meet greater than or equal to 90% of their needs  Progressing  MONITOR:   Diet advancement, PO intake, Supplement acceptance, Labs, Weight trends, Skin, I & O's  REASON FOR ASSESSMENT:   Consult Assessment of nutrition requirement/status  ASSESSMENT:   Larry Leonard is a 39 y.o. male presenting with two months of progressive abdominal pain, nausea and vomiting with imaging concerning for small bowel obstruction. PMH is significant for Chron's disease and paranoid schizophrenia.  NGT removed on the evening of 10/24/14. Pt was advanced to clear liquids, tolerating well for the past 2 meals per staff. Surgery reports pt will likely advance to full liquids later on today.   GI following. Solumedrol has been started. Stool studies and C-diff pending.   Labs reviewed.   Diet Order:  Diet clear liquid Room service appropriate?: Yes; Fluid consistency:: Thin Diet full liquid Room service appropriate?: Yes; Fluid consistency:: Thin  Skin:  Reviewed, no issues  Last BM:  10/22/14  Height:   Ht Readings from Last 1 Encounters:  10/23/14  (1.676 m)    Weight:   Wt Readings from Last 1 Encounters:  10/23/14 115 lb (52.164 kg)    Ideal Body Weight:  64.5 kg  BMI:  Body mass index is 18.57 kg/(m^2).  Estimated Nutritional Needs:   Kcal:  1550-1750  Protein:  65-75 grams  Fluid:  >1.5 L  EDUCATION NEEDS:   Education needs addressed  Siya Flurry A. Mayford Knife, RD, LDN, CDE Pager: [phone] After hours Pager: [phone]

## 2014-10-26 DIAGNOSIS — R001 Bradycardia, unspecified: Secondary | ICD-10-CM | POA: Insufficient documentation

## 2014-10-26 MED ORDER — ENSURE ENLIVE PO LIQD
237.0000 mL | Freq: Two times a day (BID) | ORAL | Status: DC
  Administered 2014-10-26 – 2014-10-27 (×3): 237 mL via ORAL

## 2014-10-26 MED ORDER — PREDNISONE 20 MG PO TABS
40.0000 mg | ORAL_TABLET | Freq: Every day | ORAL | Status: DC
  Administered 2014-10-27: 40 mg via ORAL
  Filled 2014-10-26: qty 2

## 2014-10-26 NOTE — Progress Notes (Signed)
[facility] Progress Note     Subjective: Pt doing well on fulls.  No N/V or abdominal pain.  Had Large BM last night, having good flatus.  No concerns.  Objective: Vital signs in last 24 hours: Temp:  [97.6 F (36.4 C)-98 F (36.7 C)] 98 F (36.7 C) (09/02 0519) Pulse Rate:  [49-86] 50 (09/02 0519) Resp:  [16] 16 (09/02 0519) BP: (105-116)/(64-72) 116/72 mmHg (09/02 0519) SpO2:  [98 %-100 %] 99 % (09/02 0519) Last BM Date: 10/22/14  Intake/Output from previous day: 09/01 0701 - 09/02 0700 In: 1797 [P.O.:1797] Out: 1400 [Urine:1400] Intake/Output this shift:    PE: Gen:  Alert, NAD, pleasant Abd: Soft, NT/ND, +BS, no HSM, abdominal scars noted   Lab Results:   Recent Labs  10/24/14 0958  WBC 5.0  HGB 14.3  HCT 41.9  PLT 314   BMET  Recent Labs  10/24/14 0958  NA 137  K 4.2  CL 101  CO2 28  GLUCOSE 128*  BUN 9  CREATININE 0.85  CALCIUM 9.0   PT/INR No results for input(s): LABPROT, INR in the last 72 hours. CMP     Component Value Date/Time   NA 137 10/24/2014 0958   K 4.2 10/24/2014 0958   CL 101 10/24/2014 0958   CO2 28 10/24/2014 0958   GLUCOSE 128* 10/24/2014 0958   BUN 9 10/24/2014 0958   CREATININE 0.85 10/24/2014 0958   CALCIUM 9.0 10/24/2014 0958   PROT 8.7* 10/22/2014 0548   ALBUMIN 3.8 10/22/2014 0548   AST 26 10/22/2014 0548   ALT 37 10/22/2014 0548   ALKPHOS 95 10/22/2014 0548   BILITOT 0.6 10/22/2014 0548   GFRNONAA >60 10/24/2014 0958   GFRAA >60 10/24/2014 0958   Lipase     Component Value Date/Time   LIPASE 16* 10/22/2014 0548       Studies/Results: Dg Abd Portable 1v  10/25/2014   CLINICAL DATA:  Small bowel obstruction, followup  EXAM: PORTABLE ABDOMEN - 1 VIEW  COMPARISON:  CT abdomen pelvis of 10/22/2014 and portable abdomen of 10/24/2014  FINDINGS: There has been a decrease in the degree of small bowel gaseous distention with some colonic bowel gas present. These findings indicate some improvement  in the partial small bowel obstructive pattern. No opaque calculi are noted.  IMPRESSION: Some improvement in partial small bowel obstruction.   Electronically Signed   By: Dwyane Dee M.D.   On: 10/25/2014 08:14    Anti-infectives: Anti-infectives    Start     Dose/Rate Route Frequency Ordered Stop   10/22/14 1045  cefTRIAXone (ROCEPHIN) 1 g in dextrose 5 % 50 mL IVPB     1 g 100 mL/hr over 30 Minutes Intravenous  Once 10/22/14 1034 10/22/14 1125       Assessment/Plan SBO -Clinically improved  -Tolerating fulls, advance to soft at lunch, would recommend continuing soft diet for 1-2 weeks at home and a good bowel regimen to prevent constipation -Ambulate and IS Crohn's disease  -Probable partial colectomy, colostomy, open appendectomy and colostomy reversal at [facility] in [place], [place]. Age 31-15. -No treatment for Crohn's disease in some years per his history. (no records available currently) -Per GI - they've started solumedrol, looks like stool studies were cancelled -GI considering repeat CSP once SBO sx resolved and biologic therapy Severe terminal ileal bowel wall thickening and surrounding inflammatory changes on current CT Weight loss/malnutrition Deconditioning (pt notes they watch TV at home, but no sports type activity) Tobacco Use  Paranoid Schizophrenia - Resides in group home with prior hospitalizations for suicidal ideations. Possible UTI DVT Proph - lovenox and SCD's  Will sign off, call with questions/concerns.  No surgical interventions planned at this time.  Follow up with GI, will need CSP at some point.    LOS: 4 days    Nonie Hoyer 10/26/2014, 8:11 AM Pager: [phone]

## 2014-10-26 NOTE — Progress Notes (Signed)
Brief Nutrition Follow-Up Note  Pt has been advanced to a full liquid diet; plan for soft diet at lunch. Tolerating well; PO: 50-75%. Will add Ensure Enlive po BID, each supplement provides 350 kcal and 20 grams of protein for increased nutrient provision.  Per CSW note, pt may discharge today or later this weekend back to group home.   RD will continue to follow.   Roshawn Lacina A. Mayford Knife, RD, LDN, CDE Pager: [phone] After hours Pager: [phone]

## 2014-10-26 NOTE — Clinical Social Work Note (Addendum)
CSW spoke to [place] at [facility] regarding patient's discharge.  [facility] said they can take patient on Saturday as long as he is medically ready and discharge orders have been received.  Patient will have to be transported via ambulance, and discharge summary with FL2 faxed to ALF at [phone].  [facility] requested FL2 to be faxed today, CSW faxed FL2.  CSW to continue to follow patients progress towards discharge planning.  Ervin Knack. Brynlei Klausner, MSW, Theresia Majors [phone] 10/26/2014 5:59 PM

## 2014-10-26 NOTE — Progress Notes (Signed)
Family Medicine Teaching Service Daily Progress Note Intern Pager: [phone]  Patient name: Larry Leonard Medical record number: [id number] Date of birth: 1975-10-06 Age: 39 y.o. Gender: male  Primary Care Provider: No PCP Per Patient Consultants: Surgery, GI Code Status: FULL  Pt Overview and Major Events to Date:  8/29: Admitted with SBO. NPO, NGT placed and started on Solumedrol BID. 8/30: Continued NPO, NGT with improvement in clinical status. 8/31: Discontinued NGT, advanced diet to clears. Passing flatus. 9/1: Passing flatus, single BM. Tolerated clears, regular diet well.  Assessment and Plan:  Larry Leonard is a 39 y.o. male admitted with two months of progressive abdominal pain, nausea and vomiting and found to have small bowel obstruction. PMH is significant for Chron's disease and paranoid schizophrenia.  Small Bowel Obstruction: Most likely etiology of SBO includes Chron's disease complication from terminal ileum inflammation; however, adhesions from prior abdominal surgery are another consideration. Yesterday, patient with small BM and significant clinical improvement, tolerating PO intake well. - Appreciate GI and Surgery recommendations; will continue to follow. - Advancing diet as tolerated per surgical team. Clears, fulls with dinner presently. - Per GI recommendation, will transition from IV Solumedrol 40 mg BID to prednisone 40 mg PO daily today. - Phenergan PRN - IS, encouraged ambulation  Crohn's Disease: Patient without outpatient provider and with progressive disease. Per GI, repeat colonoscopy pending improvement of SBO symptoms; however, he is unlikely to tolerate bowel prep well at this point. He may also ultimately benefit from biologic therapy such as Remicade or Humira, or from immunomodulator therapy. - F/U GI outpatient basis to establish care - Colonoscopy pending improvement of SBO  Severe Protein-Caloric Malnutrition: 35 pound weight loss over the past  two months with noted cachexia and muscle wasting on exam. Serum albumin 3.8 g/dL on admission. Likely secondary to subacute flare/progressive Crohn's disease and should improve with treatment of disease. - Nutrition following, appreciate recommendations - Vitamin D low, will recommend supplementation at discharge  Schizophrenia, Paranoid-Type: Stable. Most recent aripiprazole injection within the past week, receives q 2 weeks. - Continue home olanzapine 10 mg ghs and quetiapine 50 mg qhs - Continue to monitor  FEN/GI: Clears, fulls with dinner. PPx: Lovenox  Disposition: Continue inpatient admission pending clinical improvement. Ultimately will discharge to [facility].  Subjective:  Overnight, patient has continued to pass flatus, and reports a small bowel movement yesterday (he reports this has been his only BM since admission). Tolerated PO intake of clear liquids well throughout the day, as well as a regular dinner. Denies nausea and vomiting. Endorses mild abdominal pain solely after eating.  Objective: Temp:  [97.6 F (36.4 C)-98 F (36.7 C)] 98 F (36.7 C) (09/02 0519) Pulse Rate:  [49-86] 50 (09/02 0519) Resp:  [16] 16 (09/02 0519) BP: (105-116)/(64-72) 116/72 mmHg (09/02 0519) SpO2:  [98 %-100 %] 99 % (09/02 0519)  Physical Exam: General: Well-appearing, in no acute distress. Resting comfortably in bed. Cardiovascular: RRR without murmur/rub/gallop. Radial pulses 2+ bilaterally. Respiratory: LCTAB without crackle/wheeze. Breathing comfortably on room air. Abdomen: Soft and non-distended. Mild tenderness to palpation in the LLQ and LUQ. No masses or significant stool burden appreciated. Extremities: Thin. Warm and well-perfused without edema or erythema. No calf tenderness.   Intake/Output Summary (Last 24 hours) at 10/26/14 0657 Last data filed at 10/26/14 0300  Gross per 24 hour  Intake   1797 ml  Output   1400 ml  Net    397 ml    Laboratory: No new  labs or  imaging studies overnight.  Larry Leonard, Med Student 10/26/2014, 6:55 AM MS4, [facility] [employer] [facility] [employer] FPTS Intern pager: [phone], text pages welcome  RESIDENT ADDENDUM I have separately seen and evaluated the patient. I have discussed the findings and exam with the medical student and agree with the above not. Below are my specific findings:  S: Tolerating diet, had BM.  O: Afebrile, VSS, no distress, borderline bradycardic without murmur, abdomen benign with left-sided tenderness without rebound, skin normal.  A/P: 39 yo male with SBO related to flare of crohn's. PMH: Crohn's and smoking. Crohn's disease with acute ileitis: Continuing to improve with resolution of SBO.  NGT pulled 8/31 and pain, nausea, vomiting is somewhat improving slowly though still without BM.  - Transition to prednisone today, pain control, antiemetic prn.  - Likely discharge tomorrow. Will continue soft diet x 2 weeks and follow up with GI as outpatient for eventual colonoscopy.   Makensie Mulhall B. Jarvis Newcomer, MD, PGY-3 10/26/2014 2:09 PM

## 2014-10-26 NOTE — Discharge Summary (Signed)
Family Medicine Teaching [facility] Discharge Summary  Patient name: Larry Leonard Medical record number: [id number] Date of birth: 24-Jan-1976 Age: 39 y.o. Gender: male Date of Admission: 10/22/2014  Date of Discharge: 10/27/2014  Admitting Physician: Doreene Eland, MD  Primary Care Provider: No PCP Per Patient Consultants: Gastroenterology, General Surgery, Clinical Social Work, Nutritional Management  Indication for Hospitalization: Small Bowel Obstruction  Discharge Diagnoses/Problem List:  1. Small Bowel Obstruction - resolved 2. Crohn's Disease Exacerbation 3. Protein-Caloric Malnutrition, Severe 4. Schizophrenia, Paranoid-Type 5. Tobacco Dependence 6. Alcohol Dependence  Disposition: Discharge to [facility]  Discharge Condition: Stable, Improved  Discharge Exam:  BP 103/59 mmHg  Pulse 57  Temp(Src) 98.2 F (36.8 C) (Oral)  Resp 18  Ht 5\' 6"  (1.676 m)  Wt 115 lb (52.164 kg)  BMI 18.57 kg/m2  SpO2 96%  Gen: Thin male in no distress HEENT: MMM CV: RRR, no murmur Pulm: clear, nonlabored GI: scaphoid with several surgical scars, soft with mild generalized tenderness without rebound or guarding, no distended. Bowel sounds are normal.  Brief Hospital Course:  Larry Leonard is a 39 y.o. man with history of Crohn's disease and schizophrenia who was admitted to [facility] with small bowel obstruction. Please see hospital course by problem list below.  Small Bowel Obstruction: CT abdomen/pelvis on admission notable for severe terminal ileal bowel wall thickening and surrounding inflammatory changes, most consistent with terminal ileitis due to Crohn's disease causing small bowel obstruction. He was managed medically with bowel rest, IVF, and NG tube placement. Patient also started on IV solumedrol on day of admission, transitioned to PO prednisone on hospital day 5. He progressed well, experienced return of bowel function, and diet was advanced as appropriate. He  was discharged to home on a soft foods diet, and was instructed to continue that diet for 1-2 weeks. Also instructed to continue PO prednisone 40 mg/day for total of 14 days, then taper by 5mg  per week.  Crohn's Disease Exacerbation: Patient with 2 months of progressive abdominal pain and poor PO intake, likely secondary to flare of Crohn's disease that has not been managed on an outpatient basis for several years. Considering involvement of terminal ileum, Vitamin B12 and folate obtained and WNL. Per inpatient GI recommendations, patient will need outpatient colonoscopy following resolution of SBO and will likely benefit from either biologic therapy such as Remicade/Humira, or from immunomodulator therapy. Will follow up with [facility].  Severe Protein-Caloric Malnutrition: Patient with 30 pound weight loss in the last 2 months and serum albumin 3.8 g/dL, prealbumin 40.9 on admission. HIV negative this admission. Like attributable to poor PO intake secondary to progressive Crohn's disease. Would continue to monitor on an outpatient basis and continue nutrition supplement as appropriate.  Low Vitamin D: 19.3 ng/mL during this admission. Recommend outpatient supplementation.  Schizophrenia: Stable; continued on home antipsychotics throughout admission.  Issues for Follow Up:  1. Monitor prednisone taper: 40 mg daily for a total of 2 weeks (9/2-9/16) then down 5mg /day suggested taper to off. 2. Patient will continue soft foods diet for 2 weeks.  3. F/U with [facility] for consideration of biologic or immunomodulator therapy for treatment of Crohn's disease, as well as colonoscopy to assess disease progression. 4. Assess weight loss/nutrition supplementation as disease process is better controlled. 5. Vitamin D supplementation on outpatient basis  Significant Procedures: None  Significant Labs and Imaging:   Recent Labs Lab 10/22/14 0548 10/24/14 0958  WBC 9.3 5.0  HGB 15.5 14.3  HCT 44.6 41.9   PLT 322 314    Recent Labs Lab 10/22/14 0548 10/23/14 0318 10/24/14 0958  NA 136 136 137  K 4.2 3.6 4.2  CL 98* 98* 101  CO2 GLUCOSE 179* 141* 128*  BUN CREATININE 0.94 0.86 0.85  CALCIUM 9.9 8.9 9.0  ALKPHOS 95  --   --   AST 26  --   --   ALT 37  --   --   ALBUMIN 3.8  --   --     Prealbumin: 16.1 (low) HIV Antibody: Negative 25-OH Vitamin D: 19.3 (low) Folate: 12.8 (WNL) Vitamin B-12: 324 (WNL)  Results/Tests Pending at Time of Discharge: None  Discharge Medications:    Medication List    ASK your doctor about these medications        ABILIFY MAINTENA 400 MG Susr  Generic drug:  ARIPiprazole  Inject 400 mg into the muscle every 30 (thirty) days.     albuterol 108 (90 BASE) MCG/ACT inhaler  Commonly known as:  PROVENTIL HFA;VENTOLIN HFA  Inhale 1 puff into the lungs every 6 (six) hours as needed for wheezing or shortness of breath.     docusate sodium 100 MG capsule  Commonly known as:  COLACE  Take 100 mg by mouth daily.     HYDROcodone-acetaminophen 5-325 MG per tablet  Commonly known as:  NORCO/VICODIN  Take 1 tablet by mouth every 4 (four) hours as needed for moderate pain or severe pain.     naltrexone 50 MG tablet  Commonly known as:  DEPADE  Take 50 mg by mouth daily.     naproxen 500 MG tablet  Commonly known as:  NAPROSYN  Take 500 mg by mouth 2 (two) times daily as needed for moderate pain.     OLANZapine 10 MG tablet  Commonly known as:  ZYPREXA  Take 10 mg by mouth at bedtime.     QUEtiapine 50 MG tablet  Commonly known as:  SEROQUEL  Take 50 mg by mouth at bedtime.        Discharge Instructions: Please refer to Patient Instructions section of EMR for full details.  Patient was counseled important signs and symptoms that should prompt return to medical care, changes in medications, dietary instructions, activity restrictions, and follow up appointments.   Follow-Up Appointments: Follow-up Information     Follow up with Freddy Jaksch, MD. Schedule an appointment as soon as possible for a visit in 2 weeks.   Specialty:  Gastroenterology   Why:  hospital follow up.   Contact information:   [address] [place] [address] [phone]      Fulton Reek. Jarvis Newcomer, MD, PGY-3 10/27/2014 10:41 AM

## 2014-10-27 MED ORDER — PREDNISONE 10 MG PO TABS: 40.0000 mg | ORAL_TABLET | Freq: Every day | ORAL | Status: DC

## 2014-10-27 MED ORDER — PREDNISONE 10 MG PO TABS: 40.0000 mg | ORAL_TABLET | Freq: Every day | ORAL | Status: AC

## 2014-10-27 NOTE — Progress Notes (Addendum)
Discharge instructions gone over with patient. Home medications gone over.  Prescription for prednisone placed in patient packet for [facility].   Patient resides at [facility], assisted living. They administer his medications. Patient is to schedule a  follow up appointment with Dr. Dulce Sellar for a visit in 2 weeks. Discussed diet and signs and symptoms of worsening condition with patient. He verbalized understanding and discharge packet is enclosed for Lurena Joiner at [facility]. Patient to return to [facility] via [facility].

## 2014-10-27 NOTE — Progress Notes (Signed)
Larry Leonard 11:05 AM  Subjective: Patient doing better and his case was discussed with my partner Dr. Dulce Sellar and his hospital computer chart was reviewed and is history was reviewed and he is looking forward to eating and we discussed his Crohn's disease  Objective: Vital signs stable afebrile no acute distress abdomen is soft nontender  Assessment: Crohn's  Plan: Slowly advance diet change him to oral prednisone probably 40 mg a day and continue a 5-ASA-like apriso 4 pills once a day and follow-up with either me or Dr. Dulce Sellar in one to 2 weeks to slowly wean the prednisone and decide on any other workup plans or medicine options and please call me sooner if any further question with this hospital stay  [facility]  Pager [phone] After 5PM or if no answer call [phone]

## 2014-10-27 NOTE — Discharge Instructions (Signed)
You were recently admitted for a small bowel obstruction that was likely caused by a flair of your Crohn's disease. You were discharged with prednisone, which helps with the swelling and inflammation in your colon. Please take this  dose through 9/16, then decrease dose by  per week as follows:  - 9/17: Take  for 7 days, then  for 7 days, then  for 7 days, then  for 7 days, then  for 7 days, then  for 7 days, then  for 7 days, then off - If your doctors make a change to this regimen, follow their directions instead.  - Please follow up with [facility] for management of your Crohn's disease. They saw you in the hospital and can provide ongoing care. Please make sure to establish care with a PCP, who can also help to manage your Crohn's disease and other medical concerns.  No Primary Care Doctor:  Call [facility] at [phone] - they can help you locate a primary care doctor that accepts your insurance, provides certain services, etc.  Physician Referral [phone] Medication Assistance:  Organization Address Phone Notes  [facility]  [address]  [place], [place] [address]  [phone]  --Must be a resident of [facility]  -- Must have NO insurance coverage whatsoever (no Medicaid/ Medicare, etc.)  -- The pt. MUST have a primary care doctor that directs their care regularly and follows them in the community   [facility]   [phone]    [employer]   [phone]    Agencies that provide inexpensive medical care:  Organization Address Phone Notes  [facility]   [phone]    [facility]   [phone]    [facility]  [address], [place]  Phone: [phone], Fax: [phone]  Hours of Operation: 9 am - 6 pm, M-F. Also accepts Medicaid/Medicare and self-pay.   [facility]  [address], [place]  Phone: [phone], Fax: [phone].  Hours of Operation: 8:30 am - 5:30 pm, M-F. Also accepts Medicaid and self-pay.   [facility] [place]  [address] [place]  Phone: [phone]    [facility]  [address] Natasha Bence [place], [place]  [phone], Ext. [phone]  Mondays & Thursdays: 7-9 AM. First 15 patients are seen on a first come, first serve basis.   Medicaid-accepting [facility] Providers:  Organization Address Phone Notes  [facility]  [address], [place]  [phone]  Also accepts self-pay patients.   [facility]  [address] Laurell Josephs [place], [place]  [phone]    [facility]  [address], [place]  [phone]    [facility]  [address] [place]  [phone]    Renaye Rakers  [address], [place]  [phone]  Only accepts [place] patients after they have their name applied to their card.   Self-Pay (no insurance) in [facility]:  Organization Address Phone Notes  Sickle Cell Patients, [facility]  [address], [place]  [phone]    [facility]  [address] [place], [place]  [phone]    [facility] [place]  [phone]  [address], [place]  [phone]    [facility]/Dr. Osei-Bonsu  [address] [place] or [address], [place]  [phone]  Phone number for both [place] and [place] locations is the same.   [facility]  [address], [place]  [phone]    [facility]  [address] [place] or [address]  [phone]  [phone]    [facility]  [address] [place]  [phone], phone; [phone], fax   Sees patients 1st and 3rd Saturday of  every month. Must not qualify for public or private insurance (i.e. Medicaid, Medicare, [place] Health Choice, Veterans' Benefits)   Household income should be no more than 200% of the poverty level The clinic cannot treat you if you are pregnant or think you are pregnant  Sexually transmitted diseases are not treated at the clinic.

## 2014-10-27 NOTE — Progress Notes (Signed)
CSW (Clinical Child psychotherapist) received call from MD notifying pt is ready for dc and dc summary/order are complete. CSW called [facility] and was notified they were unaware of plan for weekend admission. CSW confirmed weekday CSW spoke with supervisor Lurena Joiner yesterday and notified CSW that pt would be able to return over weekend. CSW awaiting return phone call from facility after they have spoken with Lurena Joiner to confirm.  Harless Nakayama Weekend CSW [phone]

## 2014-10-27 NOTE — Progress Notes (Addendum)
CSW (Clinical Child psychotherapist) spoke with Quenten Raven at [facility] who confirmed CSW can dc pt back today. CSW faxed FL2 with medications and dc summary to facility. CSW (Clinical Child psychotherapist) prepared pt dc packet and placed with shadow chart. CSW arranged non-emergent ambulance transport. Pt, pt nurse, and facility informed. CSW signing off.  Harless Nakayama Weekend CSW [phone]

## 2016-11-29 IMAGING — CR DG CHEST 1V PORT
1 series · 1 of 1 positions shown · non-contrast
Comparison: 10/17/2008

CLINICAL DATA: Nasogastric tube insertion.

EXAM:
PORTABLE CHEST - 1 VIEW

[AP]
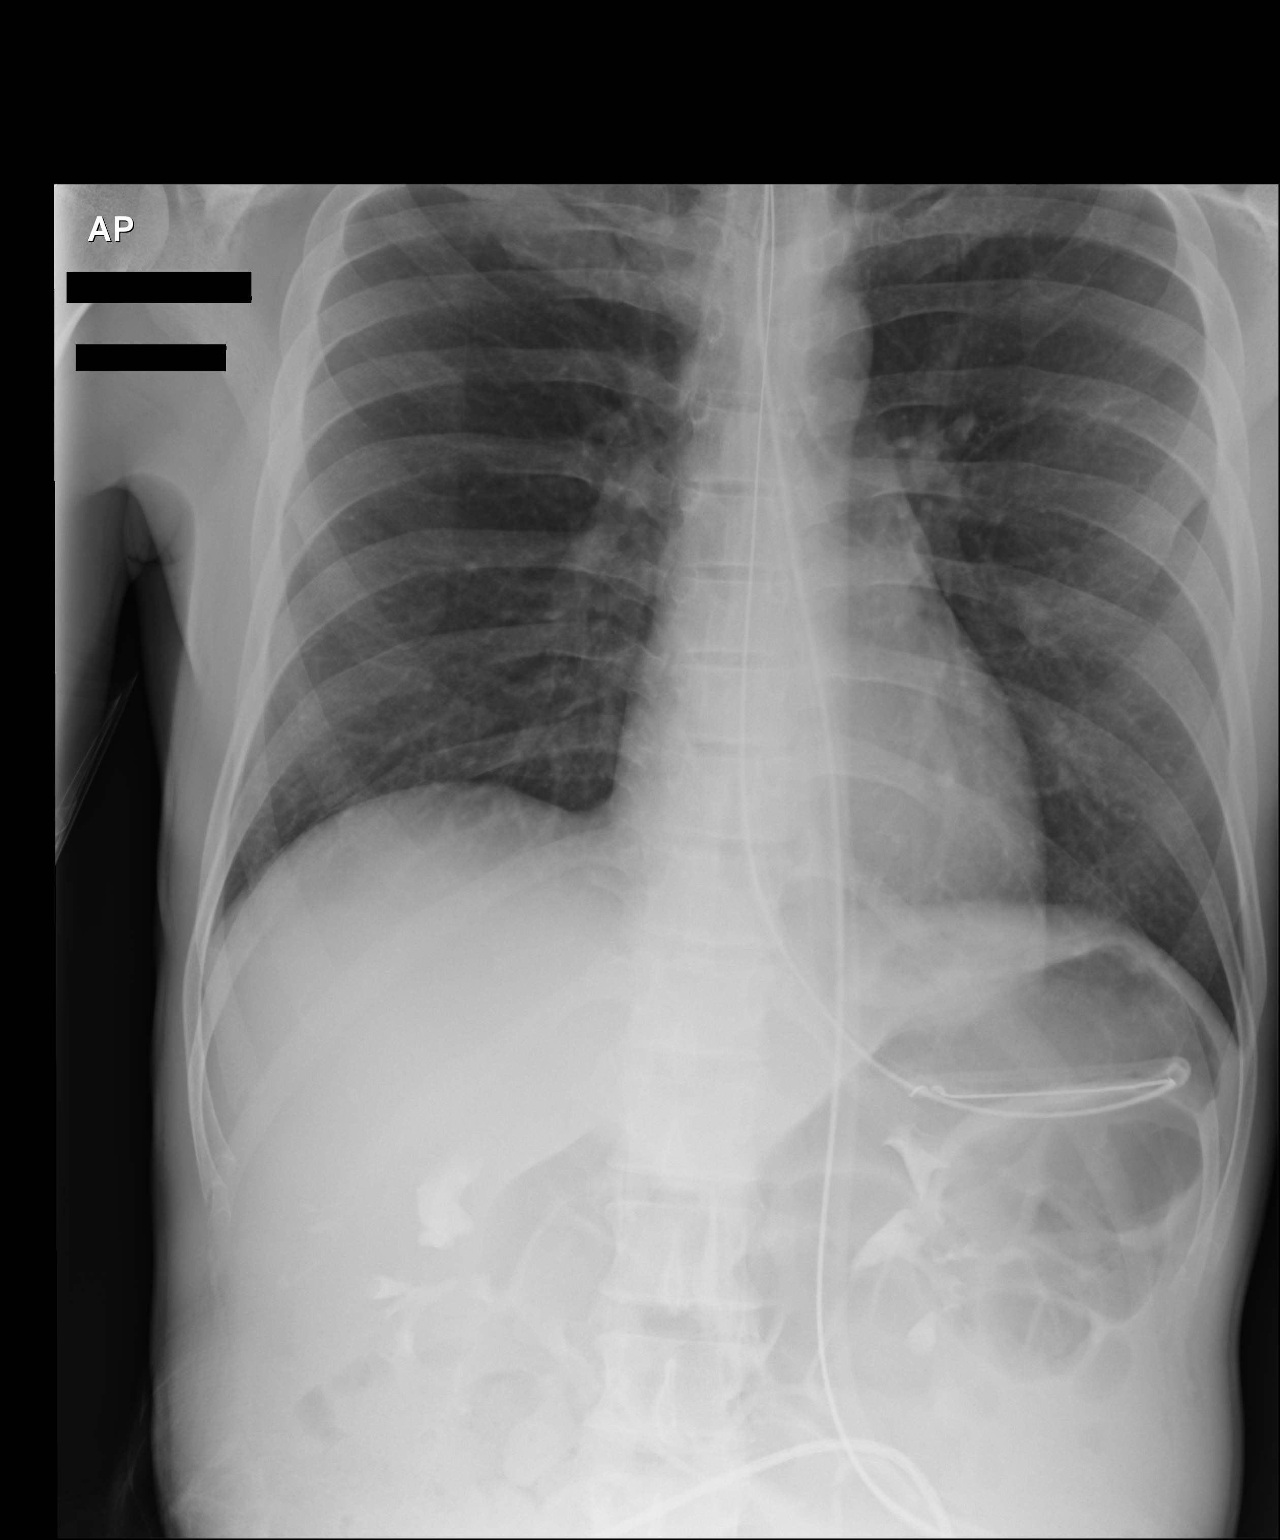

[1 of 1 positions shown; findings below may reference images not displayed]

FINDINGS: There has been placement of enteric catheter coiled on itself over
the expected location of gastric body. A second radiopaque catheter
is seen transversing the midline of thorax and upper abdomen, with
tip collimated off the image.

Cardiomediastinal silhouette is normal. Mediastinal contours appear
intact.

There is no evidence of focal airspace consolidation, pleural
effusion or pneumothorax.

Osseous structures are without acute abnormality. Residual contrast
is seen within the collecting system of both kidneys. There is
gaseous distention of the visualized small bowel loops. Soft tissues
are grossly normal.
IMPRESSION: Enteric catheter with distal tip seen overlying the expected
location of gastric body.

Second catheter transversing the thorax with tip collimated off the
image.

Gaseous distention of small bowel loops seen in the upper abdomen,
similar to previously seen small bowel obstruction, demonstrated by
CT from the same date.
